# Patient Record
Sex: Male | Born: 1955
Health system: Southern US, Community
[De-identification: ages and names within clinical notes are randomized; demographics above are authoritative.]

## PROBLEM LIST (undated history)

## (undated) DIAGNOSIS — I350 Nonrheumatic aortic (valve) stenosis: Secondary | ICD-10-CM

## (undated) DIAGNOSIS — M5136 Other intervertebral disc degeneration, lumbar region: Secondary | ICD-10-CM

## (undated) DIAGNOSIS — I4891 Unspecified atrial fibrillation: Secondary | ICD-10-CM

## (undated) DIAGNOSIS — M170 Bilateral primary osteoarthritis of knee: Secondary | ICD-10-CM

## (undated) DIAGNOSIS — M25569 Pain in unspecified knee: Secondary | ICD-10-CM

## (undated) DIAGNOSIS — I10 Essential (primary) hypertension: Secondary | ICD-10-CM

## (undated) DIAGNOSIS — Z87442 Personal history of urinary calculi: Secondary | ICD-10-CM

## (undated) DIAGNOSIS — G4730 Sleep apnea, unspecified: Secondary | ICD-10-CM

## (undated) DIAGNOSIS — G4733 Obstructive sleep apnea (adult) (pediatric): Secondary | ICD-10-CM

## (undated) DIAGNOSIS — I509 Heart failure, unspecified: Secondary | ICD-10-CM

## (undated) DIAGNOSIS — G894 Chronic pain syndrome: Secondary | ICD-10-CM

## (undated) DIAGNOSIS — G4700 Insomnia, unspecified: Secondary | ICD-10-CM

## (undated) DIAGNOSIS — T7840XA Allergy, unspecified, initial encounter: Secondary | ICD-10-CM

## (undated) DIAGNOSIS — M545 Low back pain: Secondary | ICD-10-CM

## (undated) DIAGNOSIS — M1990 Unspecified osteoarthritis, unspecified site: Secondary | ICD-10-CM

## (undated) DIAGNOSIS — I272 Pulmonary hypertension, unspecified: Secondary | ICD-10-CM

## (undated) HISTORY — DX: Unspecified atrial fibrillation: I48.91

## (undated) HISTORY — PX: LEG SURGERY: SHX1003

## (undated) HISTORY — PX: HERNIA REPAIR: SHX51

## (undated) HISTORY — DX: Essential (primary) hypertension: I10

## (undated) HISTORY — DX: Sleep apnea, unspecified: G47.30

## (undated) HISTORY — DX: Pain in unspecified knee: M25.569

## (undated) HISTORY — DX: Allergy, unspecified, initial encounter: T78.40XA

## (undated) HISTORY — DX: Other intervertebral disc degeneration, lumbar region: M51.36

## (undated) HISTORY — DX: Bilateral primary osteoarthritis of knee: M17.0

## (undated) HISTORY — DX: Heart failure, unspecified: I50.9

## (undated) HISTORY — DX: Chronic pain syndrome: G89.4

## (undated) HISTORY — DX: Obstructive sleep apnea (adult) (pediatric): G47.33

## (undated) HISTORY — DX: Insomnia, unspecified: G47.00

## (undated) HISTORY — DX: Low back pain: M54.5

## (undated) HISTORY — DX: Morbid (severe) obesity due to excess calories: E66.01

## (undated) HISTORY — PX: FRACTURE SURGERY: SHX138

## (undated) HISTORY — DX: Unspecified osteoarthritis, unspecified site: M19.90

---

## 1990-07-19 DIAGNOSIS — Z87442 Personal history of urinary calculi: Secondary | ICD-10-CM

## 1990-07-19 HISTORY — DX: Personal history of urinary calculi: Z87.442

## 2012-11-30 DIAGNOSIS — M25569 Pain in unspecified knee: Secondary | ICD-10-CM

## 2012-11-30 HISTORY — DX: Pain in unspecified knee: M25.569

## 2012-12-28 DIAGNOSIS — M170 Bilateral primary osteoarthritis of knee: Secondary | ICD-10-CM

## 2012-12-28 HISTORY — DX: Bilateral primary osteoarthritis of knee: M17.0

## 2013-01-29 DIAGNOSIS — M51369 Other intervertebral disc degeneration, lumbar region without mention of lumbar back pain or lower extremity pain: Secondary | ICD-10-CM

## 2013-01-29 DIAGNOSIS — M5136 Other intervertebral disc degeneration, lumbar region: Secondary | ICD-10-CM

## 2013-01-29 HISTORY — DX: Other intervertebral disc degeneration, lumbar region without mention of lumbar back pain or lower extremity pain: M51.369

## 2013-01-29 HISTORY — DX: Other intervertebral disc degeneration, lumbar region: M51.36

## 2013-05-28 DIAGNOSIS — I10 Essential (primary) hypertension: Secondary | ICD-10-CM | POA: Diagnosis not present

## 2013-05-28 DIAGNOSIS — L02419 Cutaneous abscess of limb, unspecified: Secondary | ICD-10-CM | POA: Diagnosis not present

## 2013-05-28 DIAGNOSIS — L97909 Non-pressure chronic ulcer of unspecified part of unspecified lower leg with unspecified severity: Secondary | ICD-10-CM | POA: Diagnosis not present

## 2013-05-28 DIAGNOSIS — I872 Venous insufficiency (chronic) (peripheral): Secondary | ICD-10-CM | POA: Diagnosis not present

## 2013-06-05 DIAGNOSIS — I872 Venous insufficiency (chronic) (peripheral): Secondary | ICD-10-CM | POA: Diagnosis not present

## 2013-06-05 DIAGNOSIS — L02419 Cutaneous abscess of limb, unspecified: Secondary | ICD-10-CM | POA: Diagnosis not present

## 2013-06-05 DIAGNOSIS — I10 Essential (primary) hypertension: Secondary | ICD-10-CM | POA: Diagnosis not present

## 2013-06-05 DIAGNOSIS — L97909 Non-pressure chronic ulcer of unspecified part of unspecified lower leg with unspecified severity: Secondary | ICD-10-CM | POA: Diagnosis not present

## 2013-06-09 DIAGNOSIS — I10 Essential (primary) hypertension: Secondary | ICD-10-CM | POA: Diagnosis not present

## 2013-06-09 DIAGNOSIS — L02419 Cutaneous abscess of limb, unspecified: Secondary | ICD-10-CM | POA: Diagnosis not present

## 2013-06-09 DIAGNOSIS — I872 Venous insufficiency (chronic) (peripheral): Secondary | ICD-10-CM | POA: Diagnosis not present

## 2013-06-09 DIAGNOSIS — L97909 Non-pressure chronic ulcer of unspecified part of unspecified lower leg with unspecified severity: Secondary | ICD-10-CM | POA: Diagnosis not present

## 2013-06-11 DIAGNOSIS — L97909 Non-pressure chronic ulcer of unspecified part of unspecified lower leg with unspecified severity: Secondary | ICD-10-CM | POA: Diagnosis not present

## 2013-06-11 DIAGNOSIS — I10 Essential (primary) hypertension: Secondary | ICD-10-CM | POA: Diagnosis not present

## 2013-06-11 DIAGNOSIS — L02419 Cutaneous abscess of limb, unspecified: Secondary | ICD-10-CM | POA: Diagnosis not present

## 2013-06-11 DIAGNOSIS — I872 Venous insufficiency (chronic) (peripheral): Secondary | ICD-10-CM | POA: Diagnosis not present

## 2013-06-13 DIAGNOSIS — I10 Essential (primary) hypertension: Secondary | ICD-10-CM | POA: Diagnosis not present

## 2013-06-13 DIAGNOSIS — L97909 Non-pressure chronic ulcer of unspecified part of unspecified lower leg with unspecified severity: Secondary | ICD-10-CM | POA: Diagnosis not present

## 2013-06-13 DIAGNOSIS — L02419 Cutaneous abscess of limb, unspecified: Secondary | ICD-10-CM | POA: Diagnosis not present

## 2013-06-13 DIAGNOSIS — I872 Venous insufficiency (chronic) (peripheral): Secondary | ICD-10-CM | POA: Diagnosis not present

## 2013-06-15 DIAGNOSIS — I872 Venous insufficiency (chronic) (peripheral): Secondary | ICD-10-CM | POA: Diagnosis not present

## 2013-06-15 DIAGNOSIS — I10 Essential (primary) hypertension: Secondary | ICD-10-CM | POA: Diagnosis not present

## 2013-06-15 DIAGNOSIS — L02419 Cutaneous abscess of limb, unspecified: Secondary | ICD-10-CM | POA: Diagnosis not present

## 2013-06-15 DIAGNOSIS — L97909 Non-pressure chronic ulcer of unspecified part of unspecified lower leg with unspecified severity: Secondary | ICD-10-CM | POA: Diagnosis not present

## 2013-06-21 DIAGNOSIS — I872 Venous insufficiency (chronic) (peripheral): Secondary | ICD-10-CM | POA: Diagnosis not present

## 2013-06-21 DIAGNOSIS — I10 Essential (primary) hypertension: Secondary | ICD-10-CM | POA: Diagnosis not present

## 2013-06-21 DIAGNOSIS — L02419 Cutaneous abscess of limb, unspecified: Secondary | ICD-10-CM | POA: Diagnosis not present

## 2013-06-21 DIAGNOSIS — L97909 Non-pressure chronic ulcer of unspecified part of unspecified lower leg with unspecified severity: Secondary | ICD-10-CM | POA: Diagnosis not present

## 2013-06-23 DIAGNOSIS — I10 Essential (primary) hypertension: Secondary | ICD-10-CM | POA: Diagnosis not present

## 2013-06-23 DIAGNOSIS — I872 Venous insufficiency (chronic) (peripheral): Secondary | ICD-10-CM | POA: Diagnosis not present

## 2013-06-23 DIAGNOSIS — L97909 Non-pressure chronic ulcer of unspecified part of unspecified lower leg with unspecified severity: Secondary | ICD-10-CM | POA: Diagnosis not present

## 2013-06-23 DIAGNOSIS — L02419 Cutaneous abscess of limb, unspecified: Secondary | ICD-10-CM | POA: Diagnosis not present

## 2013-06-26 DIAGNOSIS — I10 Essential (primary) hypertension: Secondary | ICD-10-CM | POA: Diagnosis not present

## 2013-06-26 DIAGNOSIS — I872 Venous insufficiency (chronic) (peripheral): Secondary | ICD-10-CM | POA: Diagnosis not present

## 2013-06-26 DIAGNOSIS — L97909 Non-pressure chronic ulcer of unspecified part of unspecified lower leg with unspecified severity: Secondary | ICD-10-CM | POA: Diagnosis not present

## 2013-06-26 DIAGNOSIS — L02419 Cutaneous abscess of limb, unspecified: Secondary | ICD-10-CM | POA: Diagnosis not present

## 2013-06-30 DIAGNOSIS — I10 Essential (primary) hypertension: Secondary | ICD-10-CM | POA: Diagnosis not present

## 2013-06-30 DIAGNOSIS — L97909 Non-pressure chronic ulcer of unspecified part of unspecified lower leg with unspecified severity: Secondary | ICD-10-CM | POA: Diagnosis not present

## 2013-06-30 DIAGNOSIS — L02419 Cutaneous abscess of limb, unspecified: Secondary | ICD-10-CM | POA: Diagnosis not present

## 2013-06-30 DIAGNOSIS — I872 Venous insufficiency (chronic) (peripheral): Secondary | ICD-10-CM | POA: Diagnosis not present

## 2013-07-07 DIAGNOSIS — L97909 Non-pressure chronic ulcer of unspecified part of unspecified lower leg with unspecified severity: Secondary | ICD-10-CM | POA: Diagnosis not present

## 2013-07-07 DIAGNOSIS — I10 Essential (primary) hypertension: Secondary | ICD-10-CM | POA: Diagnosis not present

## 2013-07-07 DIAGNOSIS — L02419 Cutaneous abscess of limb, unspecified: Secondary | ICD-10-CM | POA: Diagnosis not present

## 2013-07-07 DIAGNOSIS — I872 Venous insufficiency (chronic) (peripheral): Secondary | ICD-10-CM | POA: Diagnosis not present

## 2013-07-10 DIAGNOSIS — L97909 Non-pressure chronic ulcer of unspecified part of unspecified lower leg with unspecified severity: Secondary | ICD-10-CM | POA: Diagnosis not present

## 2013-07-10 DIAGNOSIS — I10 Essential (primary) hypertension: Secondary | ICD-10-CM | POA: Diagnosis not present

## 2013-07-10 DIAGNOSIS — L02419 Cutaneous abscess of limb, unspecified: Secondary | ICD-10-CM | POA: Diagnosis not present

## 2013-07-10 DIAGNOSIS — I872 Venous insufficiency (chronic) (peripheral): Secondary | ICD-10-CM | POA: Diagnosis not present

## 2013-07-17 DIAGNOSIS — L97909 Non-pressure chronic ulcer of unspecified part of unspecified lower leg with unspecified severity: Secondary | ICD-10-CM | POA: Diagnosis not present

## 2013-07-17 DIAGNOSIS — L02419 Cutaneous abscess of limb, unspecified: Secondary | ICD-10-CM | POA: Diagnosis not present

## 2013-07-17 DIAGNOSIS — I10 Essential (primary) hypertension: Secondary | ICD-10-CM | POA: Diagnosis not present

## 2013-07-17 DIAGNOSIS — I872 Venous insufficiency (chronic) (peripheral): Secondary | ICD-10-CM | POA: Diagnosis not present

## 2013-07-24 DIAGNOSIS — L97909 Non-pressure chronic ulcer of unspecified part of unspecified lower leg with unspecified severity: Secondary | ICD-10-CM | POA: Diagnosis not present

## 2013-07-24 DIAGNOSIS — L02419 Cutaneous abscess of limb, unspecified: Secondary | ICD-10-CM | POA: Diagnosis not present

## 2013-07-24 DIAGNOSIS — I872 Venous insufficiency (chronic) (peripheral): Secondary | ICD-10-CM | POA: Diagnosis not present

## 2013-07-24 DIAGNOSIS — I10 Essential (primary) hypertension: Secondary | ICD-10-CM | POA: Diagnosis not present

## 2013-07-24 DIAGNOSIS — L03119 Cellulitis of unspecified part of limb: Secondary | ICD-10-CM | POA: Diagnosis not present

## 2013-07-26 DIAGNOSIS — L03119 Cellulitis of unspecified part of limb: Secondary | ICD-10-CM | POA: Diagnosis not present

## 2013-07-26 DIAGNOSIS — I872 Venous insufficiency (chronic) (peripheral): Secondary | ICD-10-CM | POA: Diagnosis not present

## 2013-07-26 DIAGNOSIS — L02419 Cutaneous abscess of limb, unspecified: Secondary | ICD-10-CM | POA: Diagnosis not present

## 2013-07-26 DIAGNOSIS — L97209 Non-pressure chronic ulcer of unspecified calf with unspecified severity: Secondary | ICD-10-CM | POA: Diagnosis not present

## 2013-07-26 DIAGNOSIS — L97809 Non-pressure chronic ulcer of other part of unspecified lower leg with unspecified severity: Secondary | ICD-10-CM | POA: Diagnosis not present

## 2013-07-26 DIAGNOSIS — L4050 Arthropathic psoriasis, unspecified: Secondary | ICD-10-CM | POA: Diagnosis not present

## 2013-07-26 DIAGNOSIS — I10 Essential (primary) hypertension: Secondary | ICD-10-CM | POA: Diagnosis not present

## 2013-07-26 DIAGNOSIS — I890 Lymphedema, not elsewhere classified: Secondary | ICD-10-CM | POA: Diagnosis not present

## 2013-07-27 DIAGNOSIS — L02419 Cutaneous abscess of limb, unspecified: Secondary | ICD-10-CM | POA: Diagnosis not present

## 2013-07-27 DIAGNOSIS — L97909 Non-pressure chronic ulcer of unspecified part of unspecified lower leg with unspecified severity: Secondary | ICD-10-CM | POA: Diagnosis not present

## 2013-07-27 DIAGNOSIS — I872 Venous insufficiency (chronic) (peripheral): Secondary | ICD-10-CM | POA: Diagnosis not present

## 2013-07-27 DIAGNOSIS — L97929 Non-pressure chronic ulcer of unspecified part of left lower leg with unspecified severity: Secondary | ICD-10-CM | POA: Diagnosis not present

## 2013-07-27 DIAGNOSIS — I10 Essential (primary) hypertension: Secondary | ICD-10-CM | POA: Diagnosis not present

## 2013-07-27 DIAGNOSIS — I83219 Varicose veins of right lower extremity with both ulcer of unspecified site and inflammation: Secondary | ICD-10-CM | POA: Diagnosis not present

## 2013-07-30 DIAGNOSIS — L97919 Non-pressure chronic ulcer of unspecified part of right lower leg with unspecified severity: Secondary | ICD-10-CM | POA: Diagnosis not present

## 2013-07-30 DIAGNOSIS — L02419 Cutaneous abscess of limb, unspecified: Secondary | ICD-10-CM | POA: Diagnosis not present

## 2013-07-30 DIAGNOSIS — I10 Essential (primary) hypertension: Secondary | ICD-10-CM | POA: Diagnosis not present

## 2013-07-30 DIAGNOSIS — I83229 Varicose veins of left lower extremity with both ulcer of unspecified site and inflammation: Secondary | ICD-10-CM | POA: Diagnosis not present

## 2013-07-30 DIAGNOSIS — I83219 Varicose veins of right lower extremity with both ulcer of unspecified site and inflammation: Secondary | ICD-10-CM | POA: Diagnosis not present

## 2013-07-30 DIAGNOSIS — I872 Venous insufficiency (chronic) (peripheral): Secondary | ICD-10-CM | POA: Diagnosis not present

## 2013-07-30 DIAGNOSIS — L97909 Non-pressure chronic ulcer of unspecified part of unspecified lower leg with unspecified severity: Secondary | ICD-10-CM | POA: Diagnosis not present

## 2013-07-30 DIAGNOSIS — L03119 Cellulitis of unspecified part of limb: Secondary | ICD-10-CM | POA: Diagnosis not present

## 2013-07-31 DIAGNOSIS — M5137 Other intervertebral disc degeneration, lumbosacral region: Secondary | ICD-10-CM | POA: Diagnosis not present

## 2013-07-31 DIAGNOSIS — G894 Chronic pain syndrome: Secondary | ICD-10-CM | POA: Diagnosis not present

## 2013-07-31 DIAGNOSIS — M1710 Unilateral primary osteoarthritis, unspecified knee: Secondary | ICD-10-CM | POA: Diagnosis not present

## 2013-08-01 DIAGNOSIS — L03119 Cellulitis of unspecified part of limb: Secondary | ICD-10-CM | POA: Diagnosis not present

## 2013-08-01 DIAGNOSIS — L97909 Non-pressure chronic ulcer of unspecified part of unspecified lower leg with unspecified severity: Secondary | ICD-10-CM | POA: Diagnosis not present

## 2013-08-01 DIAGNOSIS — I872 Venous insufficiency (chronic) (peripheral): Secondary | ICD-10-CM | POA: Diagnosis not present

## 2013-08-01 DIAGNOSIS — I83219 Varicose veins of right lower extremity with both ulcer of unspecified site and inflammation: Secondary | ICD-10-CM | POA: Diagnosis not present

## 2013-08-01 DIAGNOSIS — I10 Essential (primary) hypertension: Secondary | ICD-10-CM | POA: Diagnosis not present

## 2013-08-01 DIAGNOSIS — L02419 Cutaneous abscess of limb, unspecified: Secondary | ICD-10-CM | POA: Diagnosis not present

## 2013-08-02 DIAGNOSIS — I890 Lymphedema, not elsewhere classified: Secondary | ICD-10-CM | POA: Diagnosis not present

## 2013-08-02 DIAGNOSIS — L02419 Cutaneous abscess of limb, unspecified: Secondary | ICD-10-CM | POA: Diagnosis not present

## 2013-08-02 DIAGNOSIS — L97809 Non-pressure chronic ulcer of other part of unspecified lower leg with unspecified severity: Secondary | ICD-10-CM | POA: Diagnosis not present

## 2013-08-02 DIAGNOSIS — L4050 Arthropathic psoriasis, unspecified: Secondary | ICD-10-CM | POA: Diagnosis not present

## 2013-08-02 DIAGNOSIS — I872 Venous insufficiency (chronic) (peripheral): Secondary | ICD-10-CM | POA: Diagnosis not present

## 2013-08-02 DIAGNOSIS — I10 Essential (primary) hypertension: Secondary | ICD-10-CM | POA: Diagnosis not present

## 2013-08-02 DIAGNOSIS — I972 Postmastectomy lymphedema syndrome: Secondary | ICD-10-CM | POA: Diagnosis not present

## 2013-08-02 DIAGNOSIS — L97209 Non-pressure chronic ulcer of unspecified calf with unspecified severity: Secondary | ICD-10-CM | POA: Diagnosis not present

## 2013-08-02 DIAGNOSIS — L03119 Cellulitis of unspecified part of limb: Secondary | ICD-10-CM | POA: Diagnosis not present

## 2013-08-06 DIAGNOSIS — M545 Low back pain, unspecified: Secondary | ICD-10-CM | POA: Diagnosis not present

## 2013-08-06 DIAGNOSIS — R5381 Other malaise: Secondary | ICD-10-CM | POA: Diagnosis not present

## 2013-08-06 DIAGNOSIS — F339 Major depressive disorder, recurrent, unspecified: Secondary | ICD-10-CM | POA: Diagnosis not present

## 2013-08-06 DIAGNOSIS — M129 Arthropathy, unspecified: Secondary | ICD-10-CM | POA: Diagnosis not present

## 2013-08-06 DIAGNOSIS — R339 Retention of urine, unspecified: Secondary | ICD-10-CM | POA: Diagnosis not present

## 2013-08-06 DIAGNOSIS — I10 Essential (primary) hypertension: Secondary | ICD-10-CM | POA: Diagnosis not present

## 2013-08-06 DIAGNOSIS — R5383 Other fatigue: Secondary | ICD-10-CM | POA: Diagnosis not present

## 2013-08-16 DIAGNOSIS — L02419 Cutaneous abscess of limb, unspecified: Secondary | ICD-10-CM | POA: Diagnosis not present

## 2013-08-16 DIAGNOSIS — L97209 Non-pressure chronic ulcer of unspecified calf with unspecified severity: Secondary | ICD-10-CM | POA: Diagnosis not present

## 2013-08-16 DIAGNOSIS — I890 Lymphedema, not elsewhere classified: Secondary | ICD-10-CM | POA: Diagnosis not present

## 2013-08-16 DIAGNOSIS — L97809 Non-pressure chronic ulcer of other part of unspecified lower leg with unspecified severity: Secondary | ICD-10-CM | POA: Diagnosis not present

## 2013-08-16 DIAGNOSIS — I872 Venous insufficiency (chronic) (peripheral): Secondary | ICD-10-CM | POA: Diagnosis not present

## 2013-08-16 DIAGNOSIS — L4050 Arthropathic psoriasis, unspecified: Secondary | ICD-10-CM | POA: Diagnosis not present

## 2013-08-16 DIAGNOSIS — L03119 Cellulitis of unspecified part of limb: Secondary | ICD-10-CM | POA: Diagnosis not present

## 2013-08-16 DIAGNOSIS — I10 Essential (primary) hypertension: Secondary | ICD-10-CM | POA: Diagnosis not present

## 2013-08-20 DIAGNOSIS — I972 Postmastectomy lymphedema syndrome: Secondary | ICD-10-CM | POA: Diagnosis not present

## 2013-08-20 DIAGNOSIS — I890 Lymphedema, not elsewhere classified: Secondary | ICD-10-CM | POA: Diagnosis not present

## 2013-08-21 DIAGNOSIS — Z880 Allergy status to penicillin: Secondary | ICD-10-CM | POA: Diagnosis not present

## 2013-08-21 DIAGNOSIS — M545 Low back pain, unspecified: Secondary | ICD-10-CM | POA: Diagnosis not present

## 2013-08-21 DIAGNOSIS — E291 Testicular hypofunction: Secondary | ICD-10-CM | POA: Diagnosis not present

## 2013-08-21 DIAGNOSIS — I10 Essential (primary) hypertension: Secondary | ICD-10-CM | POA: Diagnosis not present

## 2013-08-21 DIAGNOSIS — G894 Chronic pain syndrome: Secondary | ICD-10-CM | POA: Diagnosis not present

## 2013-08-21 DIAGNOSIS — IMO0002 Reserved for concepts with insufficient information to code with codable children: Secondary | ICD-10-CM | POA: Diagnosis not present

## 2013-08-21 DIAGNOSIS — Z79899 Other long term (current) drug therapy: Secondary | ICD-10-CM | POA: Diagnosis not present

## 2013-08-21 DIAGNOSIS — Z887 Allergy status to serum and vaccine status: Secondary | ICD-10-CM | POA: Diagnosis not present

## 2013-08-21 DIAGNOSIS — Z8249 Family history of ischemic heart disease and other diseases of the circulatory system: Secondary | ICD-10-CM | POA: Diagnosis not present

## 2013-08-30 DIAGNOSIS — G894 Chronic pain syndrome: Secondary | ICD-10-CM | POA: Diagnosis not present

## 2013-08-30 DIAGNOSIS — M5137 Other intervertebral disc degeneration, lumbosacral region: Secondary | ICD-10-CM | POA: Diagnosis not present

## 2013-08-30 DIAGNOSIS — M545 Low back pain, unspecified: Secondary | ICD-10-CM | POA: Diagnosis not present

## 2013-08-30 DIAGNOSIS — M1710 Unilateral primary osteoarthritis, unspecified knee: Secondary | ICD-10-CM | POA: Diagnosis not present

## 2013-08-31 DIAGNOSIS — L03119 Cellulitis of unspecified part of limb: Secondary | ICD-10-CM | POA: Diagnosis not present

## 2013-08-31 DIAGNOSIS — L4050 Arthropathic psoriasis, unspecified: Secondary | ICD-10-CM | POA: Diagnosis not present

## 2013-08-31 DIAGNOSIS — L02419 Cutaneous abscess of limb, unspecified: Secondary | ICD-10-CM | POA: Diagnosis not present

## 2013-08-31 DIAGNOSIS — L97809 Non-pressure chronic ulcer of other part of unspecified lower leg with unspecified severity: Secondary | ICD-10-CM | POA: Diagnosis not present

## 2013-08-31 DIAGNOSIS — L97209 Non-pressure chronic ulcer of unspecified calf with unspecified severity: Secondary | ICD-10-CM | POA: Diagnosis not present

## 2013-08-31 DIAGNOSIS — I872 Venous insufficiency (chronic) (peripheral): Secondary | ICD-10-CM | POA: Diagnosis not present

## 2013-08-31 DIAGNOSIS — I10 Essential (primary) hypertension: Secondary | ICD-10-CM | POA: Diagnosis not present

## 2013-08-31 DIAGNOSIS — I890 Lymphedema, not elsewhere classified: Secondary | ICD-10-CM | POA: Diagnosis not present

## 2013-09-19 DIAGNOSIS — M1710 Unilateral primary osteoarthritis, unspecified knee: Secondary | ICD-10-CM | POA: Diagnosis not present

## 2013-09-21 DIAGNOSIS — Z79899 Other long term (current) drug therapy: Secondary | ICD-10-CM | POA: Diagnosis not present

## 2013-09-21 DIAGNOSIS — I890 Lymphedema, not elsewhere classified: Secondary | ICD-10-CM | POA: Diagnosis not present

## 2013-09-21 DIAGNOSIS — L4050 Arthropathic psoriasis, unspecified: Secondary | ICD-10-CM | POA: Diagnosis not present

## 2013-09-21 DIAGNOSIS — I10 Essential (primary) hypertension: Secondary | ICD-10-CM | POA: Diagnosis not present

## 2013-09-21 DIAGNOSIS — L02419 Cutaneous abscess of limb, unspecified: Secondary | ICD-10-CM | POA: Diagnosis not present

## 2013-09-21 DIAGNOSIS — M25569 Pain in unspecified knee: Secondary | ICD-10-CM | POA: Diagnosis not present

## 2013-09-21 DIAGNOSIS — L97809 Non-pressure chronic ulcer of other part of unspecified lower leg with unspecified severity: Secondary | ICD-10-CM | POA: Diagnosis not present

## 2013-09-21 DIAGNOSIS — M549 Dorsalgia, unspecified: Secondary | ICD-10-CM | POA: Diagnosis not present

## 2013-09-21 DIAGNOSIS — I872 Venous insufficiency (chronic) (peripheral): Secondary | ICD-10-CM | POA: Diagnosis not present

## 2013-09-21 DIAGNOSIS — G8929 Other chronic pain: Secondary | ICD-10-CM | POA: Diagnosis not present

## 2013-09-22 DIAGNOSIS — L02419 Cutaneous abscess of limb, unspecified: Secondary | ICD-10-CM | POA: Diagnosis not present

## 2013-09-22 DIAGNOSIS — L97809 Non-pressure chronic ulcer of other part of unspecified lower leg with unspecified severity: Secondary | ICD-10-CM | POA: Diagnosis not present

## 2013-09-22 DIAGNOSIS — L4050 Arthropathic psoriasis, unspecified: Secondary | ICD-10-CM | POA: Diagnosis not present

## 2013-09-22 DIAGNOSIS — I872 Venous insufficiency (chronic) (peripheral): Secondary | ICD-10-CM | POA: Diagnosis not present

## 2013-09-22 DIAGNOSIS — I10 Essential (primary) hypertension: Secondary | ICD-10-CM | POA: Diagnosis not present

## 2013-09-22 DIAGNOSIS — I890 Lymphedema, not elsewhere classified: Secondary | ICD-10-CM | POA: Diagnosis not present

## 2013-09-23 DIAGNOSIS — I10 Essential (primary) hypertension: Secondary | ICD-10-CM | POA: Diagnosis not present

## 2013-09-23 DIAGNOSIS — L4050 Arthropathic psoriasis, unspecified: Secondary | ICD-10-CM | POA: Diagnosis not present

## 2013-09-23 DIAGNOSIS — I872 Venous insufficiency (chronic) (peripheral): Secondary | ICD-10-CM | POA: Diagnosis not present

## 2013-09-23 DIAGNOSIS — L02419 Cutaneous abscess of limb, unspecified: Secondary | ICD-10-CM | POA: Diagnosis not present

## 2013-09-23 DIAGNOSIS — L97809 Non-pressure chronic ulcer of other part of unspecified lower leg with unspecified severity: Secondary | ICD-10-CM | POA: Diagnosis not present

## 2013-09-23 DIAGNOSIS — I890 Lymphedema, not elsewhere classified: Secondary | ICD-10-CM | POA: Diagnosis not present

## 2013-09-25 DIAGNOSIS — M545 Low back pain, unspecified: Secondary | ICD-10-CM | POA: Diagnosis not present

## 2013-09-25 DIAGNOSIS — I10 Essential (primary) hypertension: Secondary | ICD-10-CM | POA: Diagnosis not present

## 2013-09-26 DIAGNOSIS — I10 Essential (primary) hypertension: Secondary | ICD-10-CM | POA: Diagnosis not present

## 2013-09-26 DIAGNOSIS — L02419 Cutaneous abscess of limb, unspecified: Secondary | ICD-10-CM | POA: Diagnosis not present

## 2013-09-26 DIAGNOSIS — I872 Venous insufficiency (chronic) (peripheral): Secondary | ICD-10-CM | POA: Diagnosis not present

## 2013-09-26 DIAGNOSIS — I890 Lymphedema, not elsewhere classified: Secondary | ICD-10-CM | POA: Diagnosis not present

## 2013-09-26 DIAGNOSIS — L97809 Non-pressure chronic ulcer of other part of unspecified lower leg with unspecified severity: Secondary | ICD-10-CM | POA: Diagnosis not present

## 2013-09-26 DIAGNOSIS — L4050 Arthropathic psoriasis, unspecified: Secondary | ICD-10-CM | POA: Diagnosis not present

## 2013-09-27 DIAGNOSIS — R1110 Vomiting, unspecified: Secondary | ICD-10-CM | POA: Diagnosis not present

## 2013-09-27 DIAGNOSIS — E860 Dehydration: Secondary | ICD-10-CM | POA: Diagnosis not present

## 2013-09-27 DIAGNOSIS — M549 Dorsalgia, unspecified: Secondary | ICD-10-CM | POA: Diagnosis present

## 2013-09-27 DIAGNOSIS — Z6841 Body Mass Index (BMI) 40.0 and over, adult: Secondary | ICD-10-CM | POA: Diagnosis not present

## 2013-09-27 DIAGNOSIS — G4733 Obstructive sleep apnea (adult) (pediatric): Secondary | ICD-10-CM | POA: Diagnosis present

## 2013-09-27 DIAGNOSIS — R197 Diarrhea, unspecified: Secondary | ICD-10-CM | POA: Diagnosis not present

## 2013-09-27 DIAGNOSIS — R5383 Other fatigue: Secondary | ICD-10-CM | POA: Diagnosis not present

## 2013-09-27 DIAGNOSIS — R161 Splenomegaly, not elsewhere classified: Secondary | ICD-10-CM | POA: Diagnosis not present

## 2013-09-27 DIAGNOSIS — I872 Venous insufficiency (chronic) (peripheral): Secondary | ICD-10-CM | POA: Diagnosis not present

## 2013-09-27 DIAGNOSIS — R5381 Other malaise: Secondary | ICD-10-CM | POA: Diagnosis not present

## 2013-09-27 DIAGNOSIS — G8929 Other chronic pain: Secondary | ICD-10-CM | POA: Diagnosis present

## 2013-09-27 DIAGNOSIS — I10 Essential (primary) hypertension: Secondary | ICD-10-CM | POA: Diagnosis not present

## 2013-09-27 DIAGNOSIS — A059 Bacterial foodborne intoxication, unspecified: Secondary | ICD-10-CM | POA: Diagnosis not present

## 2013-09-27 DIAGNOSIS — E669 Obesity, unspecified: Secondary | ICD-10-CM | POA: Diagnosis not present

## 2013-09-27 DIAGNOSIS — R110 Nausea: Secondary | ICD-10-CM | POA: Diagnosis not present

## 2013-09-27 DIAGNOSIS — L97909 Non-pressure chronic ulcer of unspecified part of unspecified lower leg with unspecified severity: Secondary | ICD-10-CM | POA: Diagnosis not present

## 2013-09-27 DIAGNOSIS — R112 Nausea with vomiting, unspecified: Secondary | ICD-10-CM | POA: Diagnosis not present

## 2013-09-27 DIAGNOSIS — R55 Syncope and collapse: Secondary | ICD-10-CM | POA: Diagnosis not present

## 2013-09-27 DIAGNOSIS — Z79899 Other long term (current) drug therapy: Secondary | ICD-10-CM | POA: Diagnosis not present

## 2013-10-02 DIAGNOSIS — L02419 Cutaneous abscess of limb, unspecified: Secondary | ICD-10-CM | POA: Diagnosis not present

## 2013-10-02 DIAGNOSIS — I10 Essential (primary) hypertension: Secondary | ICD-10-CM | POA: Diagnosis not present

## 2013-10-02 DIAGNOSIS — I872 Venous insufficiency (chronic) (peripheral): Secondary | ICD-10-CM | POA: Diagnosis not present

## 2013-10-02 DIAGNOSIS — I890 Lymphedema, not elsewhere classified: Secondary | ICD-10-CM | POA: Diagnosis not present

## 2013-10-02 DIAGNOSIS — L97809 Non-pressure chronic ulcer of other part of unspecified lower leg with unspecified severity: Secondary | ICD-10-CM | POA: Diagnosis not present

## 2013-10-02 DIAGNOSIS — L4050 Arthropathic psoriasis, unspecified: Secondary | ICD-10-CM | POA: Diagnosis not present

## 2013-10-04 DIAGNOSIS — L97809 Non-pressure chronic ulcer of other part of unspecified lower leg with unspecified severity: Secondary | ICD-10-CM | POA: Diagnosis not present

## 2013-10-04 DIAGNOSIS — L03119 Cellulitis of unspecified part of limb: Secondary | ICD-10-CM | POA: Diagnosis not present

## 2013-10-04 DIAGNOSIS — I872 Venous insufficiency (chronic) (peripheral): Secondary | ICD-10-CM | POA: Diagnosis not present

## 2013-10-04 DIAGNOSIS — L02419 Cutaneous abscess of limb, unspecified: Secondary | ICD-10-CM | POA: Diagnosis not present

## 2013-10-04 DIAGNOSIS — L4050 Arthropathic psoriasis, unspecified: Secondary | ICD-10-CM | POA: Diagnosis not present

## 2013-10-04 DIAGNOSIS — I10 Essential (primary) hypertension: Secondary | ICD-10-CM | POA: Diagnosis not present

## 2013-10-04 DIAGNOSIS — I890 Lymphedema, not elsewhere classified: Secondary | ICD-10-CM | POA: Diagnosis not present

## 2013-10-05 DIAGNOSIS — M545 Low back pain, unspecified: Secondary | ICD-10-CM | POA: Diagnosis not present

## 2013-10-05 DIAGNOSIS — I10 Essential (primary) hypertension: Secondary | ICD-10-CM | POA: Diagnosis not present

## 2013-10-06 DIAGNOSIS — L02419 Cutaneous abscess of limb, unspecified: Secondary | ICD-10-CM | POA: Diagnosis not present

## 2013-10-06 DIAGNOSIS — I890 Lymphedema, not elsewhere classified: Secondary | ICD-10-CM | POA: Diagnosis not present

## 2013-10-06 DIAGNOSIS — I10 Essential (primary) hypertension: Secondary | ICD-10-CM | POA: Diagnosis not present

## 2013-10-06 DIAGNOSIS — L97809 Non-pressure chronic ulcer of other part of unspecified lower leg with unspecified severity: Secondary | ICD-10-CM | POA: Diagnosis not present

## 2013-10-06 DIAGNOSIS — L4050 Arthropathic psoriasis, unspecified: Secondary | ICD-10-CM | POA: Diagnosis not present

## 2013-10-06 DIAGNOSIS — I872 Venous insufficiency (chronic) (peripheral): Secondary | ICD-10-CM | POA: Diagnosis not present

## 2013-10-10 DIAGNOSIS — I872 Venous insufficiency (chronic) (peripheral): Secondary | ICD-10-CM | POA: Diagnosis not present

## 2013-10-10 DIAGNOSIS — L02419 Cutaneous abscess of limb, unspecified: Secondary | ICD-10-CM | POA: Diagnosis not present

## 2013-10-10 DIAGNOSIS — I890 Lymphedema, not elsewhere classified: Secondary | ICD-10-CM | POA: Diagnosis not present

## 2013-10-10 DIAGNOSIS — L97809 Non-pressure chronic ulcer of other part of unspecified lower leg with unspecified severity: Secondary | ICD-10-CM | POA: Diagnosis not present

## 2013-10-10 DIAGNOSIS — I10 Essential (primary) hypertension: Secondary | ICD-10-CM | POA: Diagnosis not present

## 2013-10-10 DIAGNOSIS — L4050 Arthropathic psoriasis, unspecified: Secondary | ICD-10-CM | POA: Diagnosis not present

## 2013-10-10 DIAGNOSIS — L03119 Cellulitis of unspecified part of limb: Secondary | ICD-10-CM | POA: Diagnosis not present

## 2013-10-11 DIAGNOSIS — I10 Essential (primary) hypertension: Secondary | ICD-10-CM | POA: Diagnosis not present

## 2013-10-11 DIAGNOSIS — L97909 Non-pressure chronic ulcer of unspecified part of unspecified lower leg with unspecified severity: Secondary | ICD-10-CM | POA: Diagnosis not present

## 2013-10-11 DIAGNOSIS — Z79899 Other long term (current) drug therapy: Secondary | ICD-10-CM | POA: Diagnosis not present

## 2013-10-11 DIAGNOSIS — L4050 Arthropathic psoriasis, unspecified: Secondary | ICD-10-CM | POA: Diagnosis not present

## 2013-10-11 DIAGNOSIS — L97809 Non-pressure chronic ulcer of other part of unspecified lower leg with unspecified severity: Secondary | ICD-10-CM | POA: Diagnosis not present

## 2013-10-11 DIAGNOSIS — L97209 Non-pressure chronic ulcer of unspecified calf with unspecified severity: Secondary | ICD-10-CM | POA: Diagnosis not present

## 2013-10-11 DIAGNOSIS — I890 Lymphedema, not elsewhere classified: Secondary | ICD-10-CM | POA: Diagnosis not present

## 2013-10-11 DIAGNOSIS — I872 Venous insufficiency (chronic) (peripheral): Secondary | ICD-10-CM | POA: Diagnosis not present

## 2013-10-11 DIAGNOSIS — I87319 Chronic venous hypertension (idiopathic) with ulcer of unspecified lower extremity: Secondary | ICD-10-CM | POA: Diagnosis not present

## 2013-10-11 DIAGNOSIS — L02419 Cutaneous abscess of limb, unspecified: Secondary | ICD-10-CM | POA: Diagnosis not present

## 2013-10-15 DIAGNOSIS — I110 Hypertensive heart disease with heart failure: Secondary | ICD-10-CM | POA: Diagnosis not present

## 2013-10-15 DIAGNOSIS — I872 Venous insufficiency (chronic) (peripheral): Secondary | ICD-10-CM | POA: Diagnosis not present

## 2013-10-15 DIAGNOSIS — I5021 Acute systolic (congestive) heart failure: Secondary | ICD-10-CM | POA: Diagnosis not present

## 2013-10-15 DIAGNOSIS — L02419 Cutaneous abscess of limb, unspecified: Secondary | ICD-10-CM | POA: Diagnosis present

## 2013-10-15 DIAGNOSIS — J811 Chronic pulmonary edema: Secondary | ICD-10-CM | POA: Diagnosis not present

## 2013-10-15 DIAGNOSIS — Z79899 Other long term (current) drug therapy: Secondary | ICD-10-CM | POA: Diagnosis not present

## 2013-10-15 DIAGNOSIS — I10 Essential (primary) hypertension: Secondary | ICD-10-CM | POA: Diagnosis not present

## 2013-10-15 DIAGNOSIS — I498 Other specified cardiac arrhythmias: Secondary | ICD-10-CM | POA: Diagnosis present

## 2013-10-15 DIAGNOSIS — I059 Rheumatic mitral valve disease, unspecified: Secondary | ICD-10-CM | POA: Diagnosis not present

## 2013-10-15 DIAGNOSIS — L03119 Cellulitis of unspecified part of limb: Secondary | ICD-10-CM | POA: Diagnosis present

## 2013-10-15 DIAGNOSIS — J9819 Other pulmonary collapse: Secondary | ICD-10-CM | POA: Diagnosis not present

## 2013-10-15 DIAGNOSIS — L4050 Arthropathic psoriasis, unspecified: Secondary | ICD-10-CM | POA: Diagnosis present

## 2013-10-15 DIAGNOSIS — G8929 Other chronic pain: Secondary | ICD-10-CM | POA: Diagnosis present

## 2013-10-15 DIAGNOSIS — Z880 Allergy status to penicillin: Secondary | ICD-10-CM | POA: Diagnosis not present

## 2013-10-15 DIAGNOSIS — G4733 Obstructive sleep apnea (adult) (pediatric): Secondary | ICD-10-CM | POA: Diagnosis present

## 2013-10-15 DIAGNOSIS — I4891 Unspecified atrial fibrillation: Secondary | ICD-10-CM | POA: Diagnosis not present

## 2013-10-15 DIAGNOSIS — M1710 Unilateral primary osteoarthritis, unspecified knee: Secondary | ICD-10-CM | POA: Diagnosis present

## 2013-10-15 DIAGNOSIS — L97909 Non-pressure chronic ulcer of unspecified part of unspecified lower leg with unspecified severity: Secondary | ICD-10-CM | POA: Diagnosis not present

## 2013-10-15 DIAGNOSIS — Z887 Allergy status to serum and vaccine status: Secondary | ICD-10-CM | POA: Diagnosis not present

## 2013-10-15 DIAGNOSIS — R319 Hematuria, unspecified: Secondary | ICD-10-CM | POA: Diagnosis present

## 2013-10-15 DIAGNOSIS — Z6841 Body Mass Index (BMI) 40.0 and over, adult: Secondary | ICD-10-CM | POA: Diagnosis not present

## 2013-10-15 DIAGNOSIS — L97309 Non-pressure chronic ulcer of unspecified ankle with unspecified severity: Secondary | ICD-10-CM | POA: Diagnosis present

## 2013-10-15 DIAGNOSIS — I7789 Other specified disorders of arteries and arterioles: Secondary | ICD-10-CM | POA: Diagnosis not present

## 2013-10-15 DIAGNOSIS — N320 Bladder-neck obstruction: Secondary | ICD-10-CM | POA: Diagnosis present

## 2013-10-15 DIAGNOSIS — I5031 Acute diastolic (congestive) heart failure: Secondary | ICD-10-CM | POA: Diagnosis present

## 2013-10-15 DIAGNOSIS — L97209 Non-pressure chronic ulcer of unspecified calf with unspecified severity: Secondary | ICD-10-CM | POA: Diagnosis not present

## 2013-10-15 DIAGNOSIS — N35919 Unspecified urethral stricture, male, unspecified site: Secondary | ICD-10-CM | POA: Diagnosis not present

## 2013-10-15 DIAGNOSIS — Z8249 Family history of ischemic heart disease and other diseases of the circulatory system: Secondary | ICD-10-CM | POA: Diagnosis not present

## 2013-10-15 DIAGNOSIS — E662 Morbid (severe) obesity with alveolar hypoventilation: Secondary | ICD-10-CM | POA: Diagnosis not present

## 2013-10-15 DIAGNOSIS — M5137 Other intervertebral disc degeneration, lumbosacral region: Secondary | ICD-10-CM | POA: Diagnosis present

## 2013-10-15 DIAGNOSIS — R0602 Shortness of breath: Secondary | ICD-10-CM | POA: Diagnosis not present

## 2013-10-15 DIAGNOSIS — R079 Chest pain, unspecified: Secondary | ICD-10-CM | POA: Diagnosis not present

## 2013-10-15 DIAGNOSIS — Z86718 Personal history of other venous thrombosis and embolism: Secondary | ICD-10-CM | POA: Diagnosis not present

## 2013-10-15 DIAGNOSIS — I509 Heart failure, unspecified: Secondary | ICD-10-CM | POA: Diagnosis not present

## 2013-10-15 DIAGNOSIS — I2789 Other specified pulmonary heart diseases: Secondary | ICD-10-CM | POA: Diagnosis not present

## 2013-10-26 DIAGNOSIS — I890 Lymphedema, not elsewhere classified: Secondary | ICD-10-CM | POA: Diagnosis not present

## 2013-10-26 DIAGNOSIS — I10 Essential (primary) hypertension: Secondary | ICD-10-CM | POA: Diagnosis not present

## 2013-10-26 DIAGNOSIS — I872 Venous insufficiency (chronic) (peripheral): Secondary | ICD-10-CM | POA: Diagnosis not present

## 2013-10-26 DIAGNOSIS — L02419 Cutaneous abscess of limb, unspecified: Secondary | ICD-10-CM | POA: Diagnosis not present

## 2013-10-26 DIAGNOSIS — L97809 Non-pressure chronic ulcer of other part of unspecified lower leg with unspecified severity: Secondary | ICD-10-CM | POA: Diagnosis not present

## 2013-10-26 DIAGNOSIS — L4050 Arthropathic psoriasis, unspecified: Secondary | ICD-10-CM | POA: Diagnosis not present

## 2013-10-26 DIAGNOSIS — L03119 Cellulitis of unspecified part of limb: Secondary | ICD-10-CM | POA: Diagnosis not present

## 2013-10-27 DIAGNOSIS — I10 Essential (primary) hypertension: Secondary | ICD-10-CM | POA: Diagnosis not present

## 2013-10-27 DIAGNOSIS — I890 Lymphedema, not elsewhere classified: Secondary | ICD-10-CM | POA: Diagnosis not present

## 2013-10-27 DIAGNOSIS — L97809 Non-pressure chronic ulcer of other part of unspecified lower leg with unspecified severity: Secondary | ICD-10-CM | POA: Diagnosis not present

## 2013-10-27 DIAGNOSIS — I872 Venous insufficiency (chronic) (peripheral): Secondary | ICD-10-CM | POA: Diagnosis not present

## 2013-10-27 DIAGNOSIS — L02419 Cutaneous abscess of limb, unspecified: Secondary | ICD-10-CM | POA: Diagnosis not present

## 2013-10-27 DIAGNOSIS — L4050 Arthropathic psoriasis, unspecified: Secondary | ICD-10-CM | POA: Diagnosis not present

## 2013-10-28 DIAGNOSIS — L02419 Cutaneous abscess of limb, unspecified: Secondary | ICD-10-CM | POA: Diagnosis not present

## 2013-10-28 DIAGNOSIS — I872 Venous insufficiency (chronic) (peripheral): Secondary | ICD-10-CM | POA: Diagnosis not present

## 2013-10-28 DIAGNOSIS — L97809 Non-pressure chronic ulcer of other part of unspecified lower leg with unspecified severity: Secondary | ICD-10-CM | POA: Diagnosis not present

## 2013-10-28 DIAGNOSIS — I10 Essential (primary) hypertension: Secondary | ICD-10-CM | POA: Diagnosis not present

## 2013-10-28 DIAGNOSIS — I890 Lymphedema, not elsewhere classified: Secondary | ICD-10-CM | POA: Diagnosis not present

## 2013-10-28 DIAGNOSIS — L4050 Arthropathic psoriasis, unspecified: Secondary | ICD-10-CM | POA: Diagnosis not present

## 2013-10-29 DIAGNOSIS — I872 Venous insufficiency (chronic) (peripheral): Secondary | ICD-10-CM | POA: Diagnosis not present

## 2013-10-29 DIAGNOSIS — I10 Essential (primary) hypertension: Secondary | ICD-10-CM | POA: Diagnosis not present

## 2013-10-29 DIAGNOSIS — I890 Lymphedema, not elsewhere classified: Secondary | ICD-10-CM | POA: Diagnosis not present

## 2013-10-29 DIAGNOSIS — L4050 Arthropathic psoriasis, unspecified: Secondary | ICD-10-CM | POA: Diagnosis not present

## 2013-10-29 DIAGNOSIS — L97809 Non-pressure chronic ulcer of other part of unspecified lower leg with unspecified severity: Secondary | ICD-10-CM | POA: Diagnosis not present

## 2013-10-29 DIAGNOSIS — L02419 Cutaneous abscess of limb, unspecified: Secondary | ICD-10-CM | POA: Diagnosis not present

## 2013-10-29 DIAGNOSIS — L03119 Cellulitis of unspecified part of limb: Secondary | ICD-10-CM | POA: Diagnosis not present

## 2013-10-30 DIAGNOSIS — I10 Essential (primary) hypertension: Secondary | ICD-10-CM | POA: Diagnosis not present

## 2013-10-30 DIAGNOSIS — L02419 Cutaneous abscess of limb, unspecified: Secondary | ICD-10-CM | POA: Diagnosis not present

## 2013-10-30 DIAGNOSIS — I872 Venous insufficiency (chronic) (peripheral): Secondary | ICD-10-CM | POA: Diagnosis not present

## 2013-10-30 DIAGNOSIS — L03119 Cellulitis of unspecified part of limb: Secondary | ICD-10-CM | POA: Diagnosis not present

## 2013-10-30 DIAGNOSIS — I890 Lymphedema, not elsewhere classified: Secondary | ICD-10-CM | POA: Diagnosis not present

## 2013-10-30 DIAGNOSIS — L97809 Non-pressure chronic ulcer of other part of unspecified lower leg with unspecified severity: Secondary | ICD-10-CM | POA: Diagnosis not present

## 2013-10-30 DIAGNOSIS — L4050 Arthropathic psoriasis, unspecified: Secondary | ICD-10-CM | POA: Diagnosis not present

## 2013-10-31 DIAGNOSIS — M545 Low back pain, unspecified: Secondary | ICD-10-CM | POA: Diagnosis not present

## 2013-10-31 DIAGNOSIS — I10 Essential (primary) hypertension: Secondary | ICD-10-CM | POA: Diagnosis not present

## 2013-10-31 DIAGNOSIS — M25569 Pain in unspecified knee: Secondary | ICD-10-CM | POA: Diagnosis not present

## 2013-11-01 DIAGNOSIS — I872 Venous insufficiency (chronic) (peripheral): Secondary | ICD-10-CM | POA: Diagnosis not present

## 2013-11-01 DIAGNOSIS — L97809 Non-pressure chronic ulcer of other part of unspecified lower leg with unspecified severity: Secondary | ICD-10-CM | POA: Diagnosis not present

## 2013-11-01 DIAGNOSIS — L02419 Cutaneous abscess of limb, unspecified: Secondary | ICD-10-CM | POA: Diagnosis not present

## 2013-11-01 DIAGNOSIS — I890 Lymphedema, not elsewhere classified: Secondary | ICD-10-CM | POA: Diagnosis not present

## 2013-11-01 DIAGNOSIS — I10 Essential (primary) hypertension: Secondary | ICD-10-CM | POA: Diagnosis not present

## 2013-11-01 DIAGNOSIS — L4050 Arthropathic psoriasis, unspecified: Secondary | ICD-10-CM | POA: Diagnosis not present

## 2013-11-02 DIAGNOSIS — I10 Essential (primary) hypertension: Secondary | ICD-10-CM | POA: Diagnosis not present

## 2013-11-02 DIAGNOSIS — L03119 Cellulitis of unspecified part of limb: Secondary | ICD-10-CM | POA: Diagnosis not present

## 2013-11-02 DIAGNOSIS — I872 Venous insufficiency (chronic) (peripheral): Secondary | ICD-10-CM | POA: Diagnosis not present

## 2013-11-02 DIAGNOSIS — L4050 Arthropathic psoriasis, unspecified: Secondary | ICD-10-CM | POA: Diagnosis not present

## 2013-11-02 DIAGNOSIS — L97809 Non-pressure chronic ulcer of other part of unspecified lower leg with unspecified severity: Secondary | ICD-10-CM | POA: Diagnosis not present

## 2013-11-02 DIAGNOSIS — L02419 Cutaneous abscess of limb, unspecified: Secondary | ICD-10-CM | POA: Diagnosis not present

## 2013-11-02 DIAGNOSIS — I890 Lymphedema, not elsewhere classified: Secondary | ICD-10-CM | POA: Diagnosis not present

## 2013-11-05 DIAGNOSIS — I890 Lymphedema, not elsewhere classified: Secondary | ICD-10-CM | POA: Diagnosis not present

## 2013-11-05 DIAGNOSIS — L4050 Arthropathic psoriasis, unspecified: Secondary | ICD-10-CM | POA: Diagnosis not present

## 2013-11-05 DIAGNOSIS — I10 Essential (primary) hypertension: Secondary | ICD-10-CM | POA: Diagnosis not present

## 2013-11-05 DIAGNOSIS — L02419 Cutaneous abscess of limb, unspecified: Secondary | ICD-10-CM | POA: Diagnosis not present

## 2013-11-05 DIAGNOSIS — I872 Venous insufficiency (chronic) (peripheral): Secondary | ICD-10-CM | POA: Diagnosis not present

## 2013-11-05 DIAGNOSIS — L97809 Non-pressure chronic ulcer of other part of unspecified lower leg with unspecified severity: Secondary | ICD-10-CM | POA: Diagnosis not present

## 2013-11-05 DIAGNOSIS — R21 Rash and other nonspecific skin eruption: Secondary | ICD-10-CM | POA: Diagnosis not present

## 2013-11-06 DIAGNOSIS — I872 Venous insufficiency (chronic) (peripheral): Secondary | ICD-10-CM | POA: Diagnosis not present

## 2013-11-06 DIAGNOSIS — I10 Essential (primary) hypertension: Secondary | ICD-10-CM | POA: Diagnosis not present

## 2013-11-06 DIAGNOSIS — I890 Lymphedema, not elsewhere classified: Secondary | ICD-10-CM | POA: Diagnosis not present

## 2013-11-06 DIAGNOSIS — L97809 Non-pressure chronic ulcer of other part of unspecified lower leg with unspecified severity: Secondary | ICD-10-CM | POA: Diagnosis not present

## 2013-11-06 DIAGNOSIS — L4050 Arthropathic psoriasis, unspecified: Secondary | ICD-10-CM | POA: Diagnosis not present

## 2013-11-06 DIAGNOSIS — L02419 Cutaneous abscess of limb, unspecified: Secondary | ICD-10-CM | POA: Diagnosis not present

## 2013-11-07 DIAGNOSIS — L97809 Non-pressure chronic ulcer of other part of unspecified lower leg with unspecified severity: Secondary | ICD-10-CM | POA: Diagnosis not present

## 2013-11-07 DIAGNOSIS — L02419 Cutaneous abscess of limb, unspecified: Secondary | ICD-10-CM | POA: Diagnosis not present

## 2013-11-07 DIAGNOSIS — L03119 Cellulitis of unspecified part of limb: Secondary | ICD-10-CM | POA: Diagnosis not present

## 2013-11-07 DIAGNOSIS — L4050 Arthropathic psoriasis, unspecified: Secondary | ICD-10-CM | POA: Diagnosis not present

## 2013-11-07 DIAGNOSIS — I890 Lymphedema, not elsewhere classified: Secondary | ICD-10-CM | POA: Diagnosis not present

## 2013-11-07 DIAGNOSIS — I872 Venous insufficiency (chronic) (peripheral): Secondary | ICD-10-CM | POA: Diagnosis not present

## 2013-11-07 DIAGNOSIS — I10 Essential (primary) hypertension: Secondary | ICD-10-CM | POA: Diagnosis not present

## 2013-11-09 DIAGNOSIS — L4050 Arthropathic psoriasis, unspecified: Secondary | ICD-10-CM | POA: Diagnosis not present

## 2013-11-09 DIAGNOSIS — I10 Essential (primary) hypertension: Secondary | ICD-10-CM | POA: Diagnosis not present

## 2013-11-09 DIAGNOSIS — L02419 Cutaneous abscess of limb, unspecified: Secondary | ICD-10-CM | POA: Diagnosis not present

## 2013-11-09 DIAGNOSIS — I872 Venous insufficiency (chronic) (peripheral): Secondary | ICD-10-CM | POA: Diagnosis not present

## 2013-11-09 DIAGNOSIS — I890 Lymphedema, not elsewhere classified: Secondary | ICD-10-CM | POA: Diagnosis not present

## 2013-11-09 DIAGNOSIS — L97809 Non-pressure chronic ulcer of other part of unspecified lower leg with unspecified severity: Secondary | ICD-10-CM | POA: Diagnosis not present

## 2013-11-13 DIAGNOSIS — I10 Essential (primary) hypertension: Secondary | ICD-10-CM | POA: Diagnosis not present

## 2013-11-13 DIAGNOSIS — I872 Venous insufficiency (chronic) (peripheral): Secondary | ICD-10-CM | POA: Diagnosis not present

## 2013-11-13 DIAGNOSIS — L02419 Cutaneous abscess of limb, unspecified: Secondary | ICD-10-CM | POA: Diagnosis not present

## 2013-11-13 DIAGNOSIS — L4050 Arthropathic psoriasis, unspecified: Secondary | ICD-10-CM | POA: Diagnosis not present

## 2013-11-13 DIAGNOSIS — I890 Lymphedema, not elsewhere classified: Secondary | ICD-10-CM | POA: Diagnosis not present

## 2013-11-13 DIAGNOSIS — L97809 Non-pressure chronic ulcer of other part of unspecified lower leg with unspecified severity: Secondary | ICD-10-CM | POA: Diagnosis not present

## 2013-11-14 DIAGNOSIS — L97809 Non-pressure chronic ulcer of other part of unspecified lower leg with unspecified severity: Secondary | ICD-10-CM | POA: Diagnosis not present

## 2013-11-14 DIAGNOSIS — I890 Lymphedema, not elsewhere classified: Secondary | ICD-10-CM | POA: Diagnosis not present

## 2013-11-14 DIAGNOSIS — L03119 Cellulitis of unspecified part of limb: Secondary | ICD-10-CM | POA: Diagnosis not present

## 2013-11-14 DIAGNOSIS — L4050 Arthropathic psoriasis, unspecified: Secondary | ICD-10-CM | POA: Diagnosis not present

## 2013-11-14 DIAGNOSIS — I872 Venous insufficiency (chronic) (peripheral): Secondary | ICD-10-CM | POA: Diagnosis not present

## 2013-11-14 DIAGNOSIS — I10 Essential (primary) hypertension: Secondary | ICD-10-CM | POA: Diagnosis not present

## 2013-11-14 DIAGNOSIS — L02419 Cutaneous abscess of limb, unspecified: Secondary | ICD-10-CM | POA: Diagnosis not present

## 2013-11-16 DIAGNOSIS — L02419 Cutaneous abscess of limb, unspecified: Secondary | ICD-10-CM | POA: Diagnosis not present

## 2013-11-16 DIAGNOSIS — I872 Venous insufficiency (chronic) (peripheral): Secondary | ICD-10-CM | POA: Diagnosis not present

## 2013-11-16 DIAGNOSIS — L97809 Non-pressure chronic ulcer of other part of unspecified lower leg with unspecified severity: Secondary | ICD-10-CM | POA: Diagnosis not present

## 2013-11-16 DIAGNOSIS — L03119 Cellulitis of unspecified part of limb: Secondary | ICD-10-CM | POA: Diagnosis not present

## 2013-11-16 DIAGNOSIS — I890 Lymphedema, not elsewhere classified: Secondary | ICD-10-CM | POA: Diagnosis not present

## 2013-11-16 DIAGNOSIS — L4050 Arthropathic psoriasis, unspecified: Secondary | ICD-10-CM | POA: Diagnosis not present

## 2013-11-16 DIAGNOSIS — I10 Essential (primary) hypertension: Secondary | ICD-10-CM | POA: Diagnosis not present

## 2013-11-20 DIAGNOSIS — I872 Venous insufficiency (chronic) (peripheral): Secondary | ICD-10-CM | POA: Diagnosis not present

## 2013-11-20 DIAGNOSIS — L97809 Non-pressure chronic ulcer of other part of unspecified lower leg with unspecified severity: Secondary | ICD-10-CM | POA: Diagnosis not present

## 2013-11-20 DIAGNOSIS — M5137 Other intervertebral disc degeneration, lumbosacral region: Secondary | ICD-10-CM | POA: Diagnosis not present

## 2013-11-20 DIAGNOSIS — I890 Lymphedema, not elsewhere classified: Secondary | ICD-10-CM | POA: Diagnosis not present

## 2013-11-20 DIAGNOSIS — M1710 Unilateral primary osteoarthritis, unspecified knee: Secondary | ICD-10-CM | POA: Diagnosis not present

## 2013-11-20 DIAGNOSIS — I509 Heart failure, unspecified: Secondary | ICD-10-CM | POA: Diagnosis not present

## 2013-11-20 DIAGNOSIS — I5031 Acute diastolic (congestive) heart failure: Secondary | ICD-10-CM | POA: Diagnosis not present

## 2013-11-20 DIAGNOSIS — L4050 Arthropathic psoriasis, unspecified: Secondary | ICD-10-CM | POA: Diagnosis not present

## 2013-11-20 DIAGNOSIS — I10 Essential (primary) hypertension: Secondary | ICD-10-CM | POA: Diagnosis not present

## 2013-11-20 DIAGNOSIS — G4733 Obstructive sleep apnea (adult) (pediatric): Secondary | ICD-10-CM | POA: Diagnosis not present

## 2013-11-20 DIAGNOSIS — E662 Morbid (severe) obesity with alveolar hypoventilation: Secondary | ICD-10-CM | POA: Diagnosis not present

## 2013-11-24 DIAGNOSIS — I10 Essential (primary) hypertension: Secondary | ICD-10-CM | POA: Diagnosis not present

## 2013-11-24 DIAGNOSIS — M1710 Unilateral primary osteoarthritis, unspecified knee: Secondary | ICD-10-CM | POA: Diagnosis not present

## 2013-11-24 DIAGNOSIS — L97809 Non-pressure chronic ulcer of other part of unspecified lower leg with unspecified severity: Secondary | ICD-10-CM | POA: Diagnosis not present

## 2013-11-24 DIAGNOSIS — M5137 Other intervertebral disc degeneration, lumbosacral region: Secondary | ICD-10-CM | POA: Diagnosis not present

## 2013-11-24 DIAGNOSIS — I872 Venous insufficiency (chronic) (peripheral): Secondary | ICD-10-CM | POA: Diagnosis not present

## 2013-11-24 DIAGNOSIS — I890 Lymphedema, not elsewhere classified: Secondary | ICD-10-CM | POA: Diagnosis not present

## 2013-11-30 DIAGNOSIS — M1710 Unilateral primary osteoarthritis, unspecified knee: Secondary | ICD-10-CM | POA: Diagnosis not present

## 2013-11-30 DIAGNOSIS — I872 Venous insufficiency (chronic) (peripheral): Secondary | ICD-10-CM | POA: Diagnosis not present

## 2013-11-30 DIAGNOSIS — I10 Essential (primary) hypertension: Secondary | ICD-10-CM | POA: Diagnosis not present

## 2013-11-30 DIAGNOSIS — L97809 Non-pressure chronic ulcer of other part of unspecified lower leg with unspecified severity: Secondary | ICD-10-CM | POA: Diagnosis not present

## 2013-11-30 DIAGNOSIS — M5137 Other intervertebral disc degeneration, lumbosacral region: Secondary | ICD-10-CM | POA: Diagnosis not present

## 2013-11-30 DIAGNOSIS — I890 Lymphedema, not elsewhere classified: Secondary | ICD-10-CM | POA: Diagnosis not present

## 2013-12-04 DIAGNOSIS — M545 Low back pain, unspecified: Secondary | ICD-10-CM | POA: Diagnosis not present

## 2013-12-04 DIAGNOSIS — L4050 Arthropathic psoriasis, unspecified: Secondary | ICD-10-CM | POA: Diagnosis not present

## 2013-12-04 DIAGNOSIS — M159 Polyosteoarthritis, unspecified: Secondary | ICD-10-CM | POA: Diagnosis not present

## 2013-12-04 DIAGNOSIS — M5137 Other intervertebral disc degeneration, lumbosacral region: Secondary | ICD-10-CM | POA: Diagnosis not present

## 2013-12-04 DIAGNOSIS — G894 Chronic pain syndrome: Secondary | ICD-10-CM | POA: Diagnosis not present

## 2013-12-05 DIAGNOSIS — R21 Rash and other nonspecific skin eruption: Secondary | ICD-10-CM | POA: Diagnosis not present

## 2013-12-05 DIAGNOSIS — I10 Essential (primary) hypertension: Secondary | ICD-10-CM | POA: Diagnosis not present

## 2013-12-05 DIAGNOSIS — L299 Pruritus, unspecified: Secondary | ICD-10-CM | POA: Diagnosis not present

## 2013-12-07 DIAGNOSIS — I872 Venous insufficiency (chronic) (peripheral): Secondary | ICD-10-CM | POA: Diagnosis not present

## 2013-12-07 DIAGNOSIS — L97809 Non-pressure chronic ulcer of other part of unspecified lower leg with unspecified severity: Secondary | ICD-10-CM | POA: Diagnosis not present

## 2013-12-07 DIAGNOSIS — M1710 Unilateral primary osteoarthritis, unspecified knee: Secondary | ICD-10-CM | POA: Diagnosis not present

## 2013-12-07 DIAGNOSIS — I10 Essential (primary) hypertension: Secondary | ICD-10-CM | POA: Diagnosis not present

## 2013-12-07 DIAGNOSIS — I890 Lymphedema, not elsewhere classified: Secondary | ICD-10-CM | POA: Diagnosis not present

## 2013-12-07 DIAGNOSIS — M5137 Other intervertebral disc degeneration, lumbosacral region: Secondary | ICD-10-CM | POA: Diagnosis not present

## 2013-12-12 DIAGNOSIS — I890 Lymphedema, not elsewhere classified: Secondary | ICD-10-CM | POA: Diagnosis not present

## 2013-12-12 DIAGNOSIS — I10 Essential (primary) hypertension: Secondary | ICD-10-CM | POA: Diagnosis not present

## 2013-12-12 DIAGNOSIS — L97809 Non-pressure chronic ulcer of other part of unspecified lower leg with unspecified severity: Secondary | ICD-10-CM | POA: Diagnosis not present

## 2013-12-12 DIAGNOSIS — I872 Venous insufficiency (chronic) (peripheral): Secondary | ICD-10-CM | POA: Diagnosis not present

## 2013-12-12 DIAGNOSIS — M1710 Unilateral primary osteoarthritis, unspecified knee: Secondary | ICD-10-CM | POA: Diagnosis not present

## 2013-12-12 DIAGNOSIS — M5137 Other intervertebral disc degeneration, lumbosacral region: Secondary | ICD-10-CM | POA: Diagnosis not present

## 2013-12-21 DIAGNOSIS — M5137 Other intervertebral disc degeneration, lumbosacral region: Secondary | ICD-10-CM | POA: Diagnosis not present

## 2013-12-21 DIAGNOSIS — I890 Lymphedema, not elsewhere classified: Secondary | ICD-10-CM | POA: Diagnosis not present

## 2013-12-21 DIAGNOSIS — I872 Venous insufficiency (chronic) (peripheral): Secondary | ICD-10-CM | POA: Diagnosis not present

## 2013-12-21 DIAGNOSIS — I10 Essential (primary) hypertension: Secondary | ICD-10-CM | POA: Diagnosis not present

## 2013-12-21 DIAGNOSIS — M1710 Unilateral primary osteoarthritis, unspecified knee: Secondary | ICD-10-CM | POA: Diagnosis not present

## 2013-12-21 DIAGNOSIS — L97809 Non-pressure chronic ulcer of other part of unspecified lower leg with unspecified severity: Secondary | ICD-10-CM | POA: Diagnosis not present

## 2014-01-01 DIAGNOSIS — M545 Low back pain, unspecified: Secondary | ICD-10-CM | POA: Diagnosis not present

## 2014-01-01 DIAGNOSIS — M5137 Other intervertebral disc degeneration, lumbosacral region: Secondary | ICD-10-CM | POA: Diagnosis not present

## 2014-01-01 DIAGNOSIS — M159 Polyosteoarthritis, unspecified: Secondary | ICD-10-CM | POA: Diagnosis not present

## 2014-01-01 DIAGNOSIS — L4050 Arthropathic psoriasis, unspecified: Secondary | ICD-10-CM | POA: Diagnosis not present

## 2014-01-01 DIAGNOSIS — G894 Chronic pain syndrome: Secondary | ICD-10-CM | POA: Diagnosis not present

## 2014-01-01 DIAGNOSIS — M25569 Pain in unspecified knee: Secondary | ICD-10-CM | POA: Diagnosis not present

## 2014-01-29 DIAGNOSIS — M25569 Pain in unspecified knee: Secondary | ICD-10-CM | POA: Diagnosis not present

## 2014-01-29 DIAGNOSIS — G894 Chronic pain syndrome: Secondary | ICD-10-CM | POA: Diagnosis not present

## 2014-01-29 DIAGNOSIS — M159 Polyosteoarthritis, unspecified: Secondary | ICD-10-CM | POA: Diagnosis not present

## 2014-01-29 DIAGNOSIS — M545 Low back pain, unspecified: Secondary | ICD-10-CM | POA: Diagnosis not present

## 2014-01-29 DIAGNOSIS — M5137 Other intervertebral disc degeneration, lumbosacral region: Secondary | ICD-10-CM | POA: Diagnosis not present

## 2014-01-29 DIAGNOSIS — L4050 Arthropathic psoriasis, unspecified: Secondary | ICD-10-CM | POA: Diagnosis not present

## 2014-02-06 DIAGNOSIS — I10 Essential (primary) hypertension: Secondary | ICD-10-CM | POA: Diagnosis not present

## 2014-02-06 DIAGNOSIS — F4323 Adjustment disorder with mixed anxiety and depressed mood: Secondary | ICD-10-CM | POA: Diagnosis not present

## 2014-02-19 DIAGNOSIS — H66009 Acute suppurative otitis media without spontaneous rupture of ear drum, unspecified ear: Secondary | ICD-10-CM | POA: Diagnosis not present

## 2014-02-19 DIAGNOSIS — R5381 Other malaise: Secondary | ICD-10-CM | POA: Diagnosis not present

## 2014-02-19 DIAGNOSIS — L03119 Cellulitis of unspecified part of limb: Secondary | ICD-10-CM | POA: Diagnosis not present

## 2014-02-19 DIAGNOSIS — L02419 Cutaneous abscess of limb, unspecified: Secondary | ICD-10-CM | POA: Diagnosis not present

## 2014-02-19 DIAGNOSIS — R5383 Other fatigue: Secondary | ICD-10-CM | POA: Diagnosis not present

## 2014-03-07 DIAGNOSIS — M545 Low back pain, unspecified: Secondary | ICD-10-CM | POA: Diagnosis not present

## 2014-03-07 DIAGNOSIS — G894 Chronic pain syndrome: Secondary | ICD-10-CM | POA: Diagnosis not present

## 2014-03-07 DIAGNOSIS — M159 Polyosteoarthritis, unspecified: Secondary | ICD-10-CM | POA: Diagnosis not present

## 2014-03-07 DIAGNOSIS — L4050 Arthropathic psoriasis, unspecified: Secondary | ICD-10-CM | POA: Diagnosis not present

## 2014-03-07 DIAGNOSIS — M1710 Unilateral primary osteoarthritis, unspecified knee: Secondary | ICD-10-CM | POA: Diagnosis not present

## 2014-03-07 DIAGNOSIS — IMO0002 Reserved for concepts with insufficient information to code with codable children: Secondary | ICD-10-CM | POA: Diagnosis not present

## 2014-03-07 DIAGNOSIS — M5137 Other intervertebral disc degeneration, lumbosacral region: Secondary | ICD-10-CM | POA: Diagnosis not present

## 2014-03-07 DIAGNOSIS — M25569 Pain in unspecified knee: Secondary | ICD-10-CM | POA: Diagnosis not present

## 2014-04-04 DIAGNOSIS — M545 Low back pain, unspecified: Secondary | ICD-10-CM | POA: Diagnosis not present

## 2014-04-04 DIAGNOSIS — G4700 Insomnia, unspecified: Secondary | ICD-10-CM | POA: Diagnosis not present

## 2014-04-04 DIAGNOSIS — M159 Polyosteoarthritis, unspecified: Secondary | ICD-10-CM | POA: Diagnosis not present

## 2014-04-04 DIAGNOSIS — G894 Chronic pain syndrome: Secondary | ICD-10-CM | POA: Diagnosis not present

## 2014-04-04 DIAGNOSIS — I4891 Unspecified atrial fibrillation: Secondary | ICD-10-CM | POA: Diagnosis not present

## 2014-04-04 DIAGNOSIS — M25569 Pain in unspecified knee: Secondary | ICD-10-CM | POA: Diagnosis not present

## 2014-04-04 DIAGNOSIS — I10 Essential (primary) hypertension: Secondary | ICD-10-CM | POA: Diagnosis not present

## 2014-04-04 DIAGNOSIS — M1710 Unilateral primary osteoarthritis, unspecified knee: Secondary | ICD-10-CM | POA: Diagnosis not present

## 2014-04-04 DIAGNOSIS — L4050 Arthropathic psoriasis, unspecified: Secondary | ICD-10-CM | POA: Diagnosis not present

## 2014-04-04 DIAGNOSIS — M5137 Other intervertebral disc degeneration, lumbosacral region: Secondary | ICD-10-CM | POA: Diagnosis not present

## 2014-04-04 DIAGNOSIS — M129 Arthropathy, unspecified: Secondary | ICD-10-CM | POA: Diagnosis not present

## 2014-04-04 DIAGNOSIS — IMO0002 Reserved for concepts with insufficient information to code with codable children: Secondary | ICD-10-CM | POA: Diagnosis not present

## 2014-06-24 DIAGNOSIS — F3340 Major depressive disorder, recurrent, in remission, unspecified: Secondary | ICD-10-CM | POA: Diagnosis not present

## 2014-06-24 DIAGNOSIS — M545 Low back pain: Secondary | ICD-10-CM | POA: Diagnosis not present

## 2014-06-24 DIAGNOSIS — I4891 Unspecified atrial fibrillation: Secondary | ICD-10-CM | POA: Diagnosis not present

## 2014-06-24 DIAGNOSIS — I10 Essential (primary) hypertension: Secondary | ICD-10-CM | POA: Diagnosis not present

## 2014-08-27 DIAGNOSIS — I10 Essential (primary) hypertension: Secondary | ICD-10-CM | POA: Diagnosis not present

## 2014-08-27 DIAGNOSIS — Z6841 Body Mass Index (BMI) 40.0 and over, adult: Secondary | ICD-10-CM | POA: Diagnosis not present

## 2014-08-27 DIAGNOSIS — M549 Dorsalgia, unspecified: Secondary | ICD-10-CM | POA: Diagnosis not present

## 2014-08-27 DIAGNOSIS — Z79899 Other long term (current) drug therapy: Secondary | ICD-10-CM | POA: Diagnosis not present

## 2014-08-27 DIAGNOSIS — L409 Psoriasis, unspecified: Secondary | ICD-10-CM | POA: Diagnosis not present

## 2014-08-27 DIAGNOSIS — I83009 Varicose veins of unspecified lower extremity with ulcer of unspecified site: Secondary | ICD-10-CM | POA: Diagnosis not present

## 2014-08-27 DIAGNOSIS — M1990 Unspecified osteoarthritis, unspecified site: Secondary | ICD-10-CM | POA: Diagnosis not present

## 2014-08-30 DIAGNOSIS — L97911 Non-pressure chronic ulcer of unspecified part of right lower leg limited to breakdown of skin: Secondary | ICD-10-CM | POA: Diagnosis not present

## 2014-08-30 DIAGNOSIS — L97921 Non-pressure chronic ulcer of unspecified part of left lower leg limited to breakdown of skin: Secondary | ICD-10-CM | POA: Diagnosis not present

## 2014-08-30 DIAGNOSIS — E669 Obesity, unspecified: Secondary | ICD-10-CM | POA: Diagnosis not present

## 2014-08-30 DIAGNOSIS — L409 Psoriasis, unspecified: Secondary | ICD-10-CM | POA: Diagnosis not present

## 2014-09-02 DIAGNOSIS — L97911 Non-pressure chronic ulcer of unspecified part of right lower leg limited to breakdown of skin: Secondary | ICD-10-CM | POA: Diagnosis not present

## 2014-09-02 DIAGNOSIS — E669 Obesity, unspecified: Secondary | ICD-10-CM | POA: Diagnosis not present

## 2014-09-02 DIAGNOSIS — L97921 Non-pressure chronic ulcer of unspecified part of left lower leg limited to breakdown of skin: Secondary | ICD-10-CM | POA: Diagnosis not present

## 2014-09-02 DIAGNOSIS — L409 Psoriasis, unspecified: Secondary | ICD-10-CM | POA: Diagnosis not present

## 2014-09-03 DIAGNOSIS — L409 Psoriasis, unspecified: Secondary | ICD-10-CM | POA: Diagnosis not present

## 2014-09-03 DIAGNOSIS — L97921 Non-pressure chronic ulcer of unspecified part of left lower leg limited to breakdown of skin: Secondary | ICD-10-CM | POA: Diagnosis not present

## 2014-09-03 DIAGNOSIS — E669 Obesity, unspecified: Secondary | ICD-10-CM | POA: Diagnosis not present

## 2014-09-03 DIAGNOSIS — L97911 Non-pressure chronic ulcer of unspecified part of right lower leg limited to breakdown of skin: Secondary | ICD-10-CM | POA: Diagnosis not present

## 2014-09-04 DIAGNOSIS — L97921 Non-pressure chronic ulcer of unspecified part of left lower leg limited to breakdown of skin: Secondary | ICD-10-CM | POA: Diagnosis not present

## 2014-09-04 DIAGNOSIS — L409 Psoriasis, unspecified: Secondary | ICD-10-CM | POA: Diagnosis not present

## 2014-09-04 DIAGNOSIS — E669 Obesity, unspecified: Secondary | ICD-10-CM | POA: Diagnosis not present

## 2014-09-04 DIAGNOSIS — L97911 Non-pressure chronic ulcer of unspecified part of right lower leg limited to breakdown of skin: Secondary | ICD-10-CM | POA: Diagnosis not present

## 2014-09-06 DIAGNOSIS — L409 Psoriasis, unspecified: Secondary | ICD-10-CM | POA: Diagnosis not present

## 2014-09-06 DIAGNOSIS — L97921 Non-pressure chronic ulcer of unspecified part of left lower leg limited to breakdown of skin: Secondary | ICD-10-CM | POA: Diagnosis not present

## 2014-09-06 DIAGNOSIS — E669 Obesity, unspecified: Secondary | ICD-10-CM | POA: Diagnosis not present

## 2014-09-06 DIAGNOSIS — L97911 Non-pressure chronic ulcer of unspecified part of right lower leg limited to breakdown of skin: Secondary | ICD-10-CM | POA: Diagnosis not present

## 2014-09-09 DIAGNOSIS — L97911 Non-pressure chronic ulcer of unspecified part of right lower leg limited to breakdown of skin: Secondary | ICD-10-CM | POA: Diagnosis not present

## 2014-09-09 DIAGNOSIS — E669 Obesity, unspecified: Secondary | ICD-10-CM | POA: Diagnosis not present

## 2014-09-09 DIAGNOSIS — L97921 Non-pressure chronic ulcer of unspecified part of left lower leg limited to breakdown of skin: Secondary | ICD-10-CM | POA: Diagnosis not present

## 2014-09-09 DIAGNOSIS — L409 Psoriasis, unspecified: Secondary | ICD-10-CM | POA: Diagnosis not present

## 2014-09-10 DIAGNOSIS — E291 Testicular hypofunction: Secondary | ICD-10-CM | POA: Diagnosis not present

## 2014-09-10 DIAGNOSIS — E669 Obesity, unspecified: Secondary | ICD-10-CM | POA: Diagnosis not present

## 2014-09-10 DIAGNOSIS — Z79899 Other long term (current) drug therapy: Secondary | ICD-10-CM | POA: Diagnosis not present

## 2014-09-10 DIAGNOSIS — M549 Dorsalgia, unspecified: Secondary | ICD-10-CM | POA: Diagnosis not present

## 2014-09-10 DIAGNOSIS — I83009 Varicose veins of unspecified lower extremity with ulcer of unspecified site: Secondary | ICD-10-CM | POA: Diagnosis not present

## 2014-09-10 DIAGNOSIS — L97921 Non-pressure chronic ulcer of unspecified part of left lower leg limited to breakdown of skin: Secondary | ICD-10-CM | POA: Diagnosis not present

## 2014-09-10 DIAGNOSIS — L409 Psoriasis, unspecified: Secondary | ICD-10-CM | POA: Diagnosis not present

## 2014-09-10 DIAGNOSIS — I4891 Unspecified atrial fibrillation: Secondary | ICD-10-CM | POA: Diagnosis not present

## 2014-09-10 DIAGNOSIS — I10 Essential (primary) hypertension: Secondary | ICD-10-CM | POA: Diagnosis not present

## 2014-09-10 DIAGNOSIS — L97911 Non-pressure chronic ulcer of unspecified part of right lower leg limited to breakdown of skin: Secondary | ICD-10-CM | POA: Diagnosis not present

## 2014-09-10 DIAGNOSIS — Z1389 Encounter for screening for other disorder: Secondary | ICD-10-CM | POA: Diagnosis not present

## 2014-09-11 DIAGNOSIS — E669 Obesity, unspecified: Secondary | ICD-10-CM | POA: Diagnosis not present

## 2014-09-11 DIAGNOSIS — L97911 Non-pressure chronic ulcer of unspecified part of right lower leg limited to breakdown of skin: Secondary | ICD-10-CM | POA: Diagnosis not present

## 2014-09-11 DIAGNOSIS — L97921 Non-pressure chronic ulcer of unspecified part of left lower leg limited to breakdown of skin: Secondary | ICD-10-CM | POA: Diagnosis not present

## 2014-09-11 DIAGNOSIS — L409 Psoriasis, unspecified: Secondary | ICD-10-CM | POA: Diagnosis not present

## 2014-09-12 DIAGNOSIS — L97921 Non-pressure chronic ulcer of unspecified part of left lower leg limited to breakdown of skin: Secondary | ICD-10-CM | POA: Diagnosis not present

## 2014-09-12 DIAGNOSIS — L97911 Non-pressure chronic ulcer of unspecified part of right lower leg limited to breakdown of skin: Secondary | ICD-10-CM | POA: Diagnosis not present

## 2014-09-12 DIAGNOSIS — L409 Psoriasis, unspecified: Secondary | ICD-10-CM | POA: Diagnosis not present

## 2014-09-12 DIAGNOSIS — E669 Obesity, unspecified: Secondary | ICD-10-CM | POA: Diagnosis not present

## 2014-09-13 DIAGNOSIS — E669 Obesity, unspecified: Secondary | ICD-10-CM | POA: Diagnosis not present

## 2014-09-13 DIAGNOSIS — L97911 Non-pressure chronic ulcer of unspecified part of right lower leg limited to breakdown of skin: Secondary | ICD-10-CM | POA: Diagnosis not present

## 2014-09-13 DIAGNOSIS — L409 Psoriasis, unspecified: Secondary | ICD-10-CM | POA: Diagnosis not present

## 2014-09-13 DIAGNOSIS — L97921 Non-pressure chronic ulcer of unspecified part of left lower leg limited to breakdown of skin: Secondary | ICD-10-CM | POA: Diagnosis not present

## 2014-09-16 DIAGNOSIS — E669 Obesity, unspecified: Secondary | ICD-10-CM | POA: Diagnosis not present

## 2014-09-16 DIAGNOSIS — L409 Psoriasis, unspecified: Secondary | ICD-10-CM | POA: Diagnosis not present

## 2014-09-16 DIAGNOSIS — L97921 Non-pressure chronic ulcer of unspecified part of left lower leg limited to breakdown of skin: Secondary | ICD-10-CM | POA: Diagnosis not present

## 2014-09-16 DIAGNOSIS — L97911 Non-pressure chronic ulcer of unspecified part of right lower leg limited to breakdown of skin: Secondary | ICD-10-CM | POA: Diagnosis not present

## 2014-09-18 DIAGNOSIS — I4891 Unspecified atrial fibrillation: Secondary | ICD-10-CM | POA: Diagnosis not present

## 2014-09-19 DIAGNOSIS — L409 Psoriasis, unspecified: Secondary | ICD-10-CM | POA: Diagnosis not present

## 2014-09-19 DIAGNOSIS — E669 Obesity, unspecified: Secondary | ICD-10-CM | POA: Diagnosis not present

## 2014-09-19 DIAGNOSIS — L97911 Non-pressure chronic ulcer of unspecified part of right lower leg limited to breakdown of skin: Secondary | ICD-10-CM | POA: Diagnosis not present

## 2014-09-19 DIAGNOSIS — L97921 Non-pressure chronic ulcer of unspecified part of left lower leg limited to breakdown of skin: Secondary | ICD-10-CM | POA: Diagnosis not present

## 2014-09-23 DIAGNOSIS — L97911 Non-pressure chronic ulcer of unspecified part of right lower leg limited to breakdown of skin: Secondary | ICD-10-CM | POA: Diagnosis not present

## 2014-09-23 DIAGNOSIS — L97921 Non-pressure chronic ulcer of unspecified part of left lower leg limited to breakdown of skin: Secondary | ICD-10-CM | POA: Diagnosis not present

## 2014-09-23 DIAGNOSIS — L409 Psoriasis, unspecified: Secondary | ICD-10-CM | POA: Diagnosis not present

## 2014-09-23 DIAGNOSIS — E669 Obesity, unspecified: Secondary | ICD-10-CM | POA: Diagnosis not present

## 2014-09-26 DIAGNOSIS — I4891 Unspecified atrial fibrillation: Secondary | ICD-10-CM | POA: Diagnosis not present

## 2014-09-27 DIAGNOSIS — E669 Obesity, unspecified: Secondary | ICD-10-CM | POA: Diagnosis not present

## 2014-09-27 DIAGNOSIS — L97911 Non-pressure chronic ulcer of unspecified part of right lower leg limited to breakdown of skin: Secondary | ICD-10-CM | POA: Diagnosis not present

## 2014-09-27 DIAGNOSIS — L409 Psoriasis, unspecified: Secondary | ICD-10-CM | POA: Diagnosis not present

## 2014-09-27 DIAGNOSIS — L97921 Non-pressure chronic ulcer of unspecified part of left lower leg limited to breakdown of skin: Secondary | ICD-10-CM | POA: Diagnosis not present

## 2014-09-30 DIAGNOSIS — L97911 Non-pressure chronic ulcer of unspecified part of right lower leg limited to breakdown of skin: Secondary | ICD-10-CM | POA: Diagnosis not present

## 2014-09-30 DIAGNOSIS — L97921 Non-pressure chronic ulcer of unspecified part of left lower leg limited to breakdown of skin: Secondary | ICD-10-CM | POA: Diagnosis not present

## 2014-09-30 DIAGNOSIS — M545 Low back pain: Secondary | ICD-10-CM | POA: Diagnosis not present

## 2014-09-30 DIAGNOSIS — L409 Psoriasis, unspecified: Secondary | ICD-10-CM | POA: Diagnosis not present

## 2014-09-30 DIAGNOSIS — M47896 Other spondylosis, lumbar region: Secondary | ICD-10-CM | POA: Diagnosis not present

## 2014-09-30 DIAGNOSIS — M47816 Spondylosis without myelopathy or radiculopathy, lumbar region: Secondary | ICD-10-CM | POA: Diagnosis not present

## 2014-09-30 DIAGNOSIS — E669 Obesity, unspecified: Secondary | ICD-10-CM | POA: Diagnosis not present

## 2014-10-02 DIAGNOSIS — L97911 Non-pressure chronic ulcer of unspecified part of right lower leg limited to breakdown of skin: Secondary | ICD-10-CM | POA: Diagnosis not present

## 2014-10-02 DIAGNOSIS — L97921 Non-pressure chronic ulcer of unspecified part of left lower leg limited to breakdown of skin: Secondary | ICD-10-CM | POA: Diagnosis not present

## 2014-10-02 DIAGNOSIS — L409 Psoriasis, unspecified: Secondary | ICD-10-CM | POA: Diagnosis not present

## 2014-10-02 DIAGNOSIS — E669 Obesity, unspecified: Secondary | ICD-10-CM | POA: Diagnosis not present

## 2014-10-03 DIAGNOSIS — L409 Psoriasis, unspecified: Secondary | ICD-10-CM | POA: Diagnosis not present

## 2014-10-03 DIAGNOSIS — E669 Obesity, unspecified: Secondary | ICD-10-CM | POA: Diagnosis not present

## 2014-10-03 DIAGNOSIS — L97921 Non-pressure chronic ulcer of unspecified part of left lower leg limited to breakdown of skin: Secondary | ICD-10-CM | POA: Diagnosis not present

## 2014-10-03 DIAGNOSIS — L97911 Non-pressure chronic ulcer of unspecified part of right lower leg limited to breakdown of skin: Secondary | ICD-10-CM | POA: Diagnosis not present

## 2014-10-07 DIAGNOSIS — L97911 Non-pressure chronic ulcer of unspecified part of right lower leg limited to breakdown of skin: Secondary | ICD-10-CM | POA: Diagnosis not present

## 2014-10-07 DIAGNOSIS — L97921 Non-pressure chronic ulcer of unspecified part of left lower leg limited to breakdown of skin: Secondary | ICD-10-CM | POA: Diagnosis not present

## 2014-10-07 DIAGNOSIS — E669 Obesity, unspecified: Secondary | ICD-10-CM | POA: Diagnosis not present

## 2014-10-07 DIAGNOSIS — L409 Psoriasis, unspecified: Secondary | ICD-10-CM | POA: Diagnosis not present

## 2014-10-09 DIAGNOSIS — L409 Psoriasis, unspecified: Secondary | ICD-10-CM | POA: Diagnosis not present

## 2014-10-09 DIAGNOSIS — L97921 Non-pressure chronic ulcer of unspecified part of left lower leg limited to breakdown of skin: Secondary | ICD-10-CM | POA: Diagnosis not present

## 2014-10-09 DIAGNOSIS — E669 Obesity, unspecified: Secondary | ICD-10-CM | POA: Diagnosis not present

## 2014-10-09 DIAGNOSIS — L97911 Non-pressure chronic ulcer of unspecified part of right lower leg limited to breakdown of skin: Secondary | ICD-10-CM | POA: Diagnosis not present

## 2014-10-10 DIAGNOSIS — L97911 Non-pressure chronic ulcer of unspecified part of right lower leg limited to breakdown of skin: Secondary | ICD-10-CM | POA: Diagnosis not present

## 2014-10-10 DIAGNOSIS — L97921 Non-pressure chronic ulcer of unspecified part of left lower leg limited to breakdown of skin: Secondary | ICD-10-CM | POA: Diagnosis not present

## 2014-10-10 DIAGNOSIS — E669 Obesity, unspecified: Secondary | ICD-10-CM | POA: Diagnosis not present

## 2014-10-10 DIAGNOSIS — L409 Psoriasis, unspecified: Secondary | ICD-10-CM | POA: Diagnosis not present

## 2014-10-14 DIAGNOSIS — L97921 Non-pressure chronic ulcer of unspecified part of left lower leg limited to breakdown of skin: Secondary | ICD-10-CM | POA: Diagnosis not present

## 2014-10-14 DIAGNOSIS — E669 Obesity, unspecified: Secondary | ICD-10-CM | POA: Diagnosis not present

## 2014-10-14 DIAGNOSIS — L97911 Non-pressure chronic ulcer of unspecified part of right lower leg limited to breakdown of skin: Secondary | ICD-10-CM | POA: Diagnosis not present

## 2014-10-14 DIAGNOSIS — L409 Psoriasis, unspecified: Secondary | ICD-10-CM | POA: Diagnosis not present

## 2014-10-16 DIAGNOSIS — L97911 Non-pressure chronic ulcer of unspecified part of right lower leg limited to breakdown of skin: Secondary | ICD-10-CM | POA: Diagnosis not present

## 2014-10-16 DIAGNOSIS — L409 Psoriasis, unspecified: Secondary | ICD-10-CM | POA: Diagnosis not present

## 2014-10-16 DIAGNOSIS — E669 Obesity, unspecified: Secondary | ICD-10-CM | POA: Diagnosis not present

## 2014-10-16 DIAGNOSIS — L97921 Non-pressure chronic ulcer of unspecified part of left lower leg limited to breakdown of skin: Secondary | ICD-10-CM | POA: Diagnosis not present

## 2014-10-17 DIAGNOSIS — E669 Obesity, unspecified: Secondary | ICD-10-CM | POA: Diagnosis not present

## 2014-10-17 DIAGNOSIS — L97921 Non-pressure chronic ulcer of unspecified part of left lower leg limited to breakdown of skin: Secondary | ICD-10-CM | POA: Diagnosis not present

## 2014-10-17 DIAGNOSIS — L409 Psoriasis, unspecified: Secondary | ICD-10-CM | POA: Diagnosis not present

## 2014-10-17 DIAGNOSIS — L97911 Non-pressure chronic ulcer of unspecified part of right lower leg limited to breakdown of skin: Secondary | ICD-10-CM | POA: Diagnosis not present

## 2014-10-21 DIAGNOSIS — E669 Obesity, unspecified: Secondary | ICD-10-CM | POA: Diagnosis not present

## 2014-10-21 DIAGNOSIS — L97921 Non-pressure chronic ulcer of unspecified part of left lower leg limited to breakdown of skin: Secondary | ICD-10-CM | POA: Diagnosis not present

## 2014-10-21 DIAGNOSIS — L409 Psoriasis, unspecified: Secondary | ICD-10-CM | POA: Diagnosis not present

## 2014-10-21 DIAGNOSIS — L97911 Non-pressure chronic ulcer of unspecified part of right lower leg limited to breakdown of skin: Secondary | ICD-10-CM | POA: Diagnosis not present

## 2014-10-22 DIAGNOSIS — I4891 Unspecified atrial fibrillation: Secondary | ICD-10-CM | POA: Diagnosis not present

## 2014-10-22 DIAGNOSIS — L409 Psoriasis, unspecified: Secondary | ICD-10-CM | POA: Diagnosis not present

## 2014-10-22 DIAGNOSIS — Z1389 Encounter for screening for other disorder: Secondary | ICD-10-CM | POA: Diagnosis not present

## 2014-10-22 DIAGNOSIS — E291 Testicular hypofunction: Secondary | ICD-10-CM | POA: Diagnosis not present

## 2014-10-22 DIAGNOSIS — M549 Dorsalgia, unspecified: Secondary | ICD-10-CM | POA: Diagnosis not present

## 2014-10-22 DIAGNOSIS — Z79899 Other long term (current) drug therapy: Secondary | ICD-10-CM | POA: Diagnosis not present

## 2014-10-22 DIAGNOSIS — I10 Essential (primary) hypertension: Secondary | ICD-10-CM | POA: Diagnosis not present

## 2014-10-22 DIAGNOSIS — I83009 Varicose veins of unspecified lower extremity with ulcer of unspecified site: Secondary | ICD-10-CM | POA: Diagnosis not present

## 2014-10-24 DIAGNOSIS — L409 Psoriasis, unspecified: Secondary | ICD-10-CM | POA: Diagnosis not present

## 2014-10-24 DIAGNOSIS — L97911 Non-pressure chronic ulcer of unspecified part of right lower leg limited to breakdown of skin: Secondary | ICD-10-CM | POA: Diagnosis not present

## 2014-10-24 DIAGNOSIS — E669 Obesity, unspecified: Secondary | ICD-10-CM | POA: Diagnosis not present

## 2014-10-24 DIAGNOSIS — L97921 Non-pressure chronic ulcer of unspecified part of left lower leg limited to breakdown of skin: Secondary | ICD-10-CM | POA: Diagnosis not present

## 2014-10-29 DIAGNOSIS — L97921 Non-pressure chronic ulcer of unspecified part of left lower leg limited to breakdown of skin: Secondary | ICD-10-CM | POA: Diagnosis not present

## 2014-10-29 DIAGNOSIS — L97911 Non-pressure chronic ulcer of unspecified part of right lower leg limited to breakdown of skin: Secondary | ICD-10-CM | POA: Diagnosis not present

## 2014-10-29 DIAGNOSIS — L409 Psoriasis, unspecified: Secondary | ICD-10-CM | POA: Diagnosis not present

## 2014-10-29 DIAGNOSIS — E669 Obesity, unspecified: Secondary | ICD-10-CM | POA: Diagnosis not present

## 2014-10-31 DIAGNOSIS — L97921 Non-pressure chronic ulcer of unspecified part of left lower leg limited to breakdown of skin: Secondary | ICD-10-CM | POA: Diagnosis not present

## 2014-10-31 DIAGNOSIS — L409 Psoriasis, unspecified: Secondary | ICD-10-CM | POA: Diagnosis not present

## 2014-10-31 DIAGNOSIS — L97911 Non-pressure chronic ulcer of unspecified part of right lower leg limited to breakdown of skin: Secondary | ICD-10-CM | POA: Diagnosis not present

## 2014-10-31 DIAGNOSIS — E669 Obesity, unspecified: Secondary | ICD-10-CM | POA: Diagnosis not present

## 2014-11-04 DIAGNOSIS — L97921 Non-pressure chronic ulcer of unspecified part of left lower leg limited to breakdown of skin: Secondary | ICD-10-CM | POA: Diagnosis not present

## 2014-11-04 DIAGNOSIS — E669 Obesity, unspecified: Secondary | ICD-10-CM | POA: Diagnosis not present

## 2014-11-04 DIAGNOSIS — L97911 Non-pressure chronic ulcer of unspecified part of right lower leg limited to breakdown of skin: Secondary | ICD-10-CM | POA: Diagnosis not present

## 2014-11-04 DIAGNOSIS — L409 Psoriasis, unspecified: Secondary | ICD-10-CM | POA: Diagnosis not present

## 2014-11-07 DIAGNOSIS — L97911 Non-pressure chronic ulcer of unspecified part of right lower leg limited to breakdown of skin: Secondary | ICD-10-CM | POA: Diagnosis not present

## 2014-11-07 DIAGNOSIS — L97921 Non-pressure chronic ulcer of unspecified part of left lower leg limited to breakdown of skin: Secondary | ICD-10-CM | POA: Diagnosis not present

## 2014-11-07 DIAGNOSIS — L409 Psoriasis, unspecified: Secondary | ICD-10-CM | POA: Diagnosis not present

## 2014-11-07 DIAGNOSIS — E669 Obesity, unspecified: Secondary | ICD-10-CM | POA: Diagnosis not present

## 2014-11-11 DIAGNOSIS — E291 Testicular hypofunction: Secondary | ICD-10-CM | POA: Diagnosis not present

## 2014-11-11 DIAGNOSIS — I10 Essential (primary) hypertension: Secondary | ICD-10-CM | POA: Diagnosis not present

## 2014-11-11 DIAGNOSIS — M549 Dorsalgia, unspecified: Secondary | ICD-10-CM | POA: Diagnosis not present

## 2014-11-11 DIAGNOSIS — L409 Psoriasis, unspecified: Secondary | ICD-10-CM | POA: Diagnosis not present

## 2014-11-11 DIAGNOSIS — I83009 Varicose veins of unspecified lower extremity with ulcer of unspecified site: Secondary | ICD-10-CM | POA: Diagnosis not present

## 2014-11-11 DIAGNOSIS — L97911 Non-pressure chronic ulcer of unspecified part of right lower leg limited to breakdown of skin: Secondary | ICD-10-CM | POA: Diagnosis not present

## 2014-11-11 DIAGNOSIS — E669 Obesity, unspecified: Secondary | ICD-10-CM | POA: Diagnosis not present

## 2014-11-11 DIAGNOSIS — I4891 Unspecified atrial fibrillation: Secondary | ICD-10-CM | POA: Diagnosis not present

## 2014-11-11 DIAGNOSIS — Z6841 Body Mass Index (BMI) 40.0 and over, adult: Secondary | ICD-10-CM | POA: Diagnosis not present

## 2014-11-11 DIAGNOSIS — L97921 Non-pressure chronic ulcer of unspecified part of left lower leg limited to breakdown of skin: Secondary | ICD-10-CM | POA: Diagnosis not present

## 2014-11-12 DIAGNOSIS — L97911 Non-pressure chronic ulcer of unspecified part of right lower leg limited to breakdown of skin: Secondary | ICD-10-CM | POA: Diagnosis not present

## 2014-11-12 DIAGNOSIS — E669 Obesity, unspecified: Secondary | ICD-10-CM | POA: Diagnosis not present

## 2014-11-12 DIAGNOSIS — L97921 Non-pressure chronic ulcer of unspecified part of left lower leg limited to breakdown of skin: Secondary | ICD-10-CM | POA: Diagnosis not present

## 2014-11-12 DIAGNOSIS — L409 Psoriasis, unspecified: Secondary | ICD-10-CM | POA: Diagnosis not present

## 2014-11-14 DIAGNOSIS — E669 Obesity, unspecified: Secondary | ICD-10-CM | POA: Diagnosis not present

## 2014-11-14 DIAGNOSIS — L97921 Non-pressure chronic ulcer of unspecified part of left lower leg limited to breakdown of skin: Secondary | ICD-10-CM | POA: Diagnosis not present

## 2014-11-14 DIAGNOSIS — L97911 Non-pressure chronic ulcer of unspecified part of right lower leg limited to breakdown of skin: Secondary | ICD-10-CM | POA: Diagnosis not present

## 2014-11-14 DIAGNOSIS — L409 Psoriasis, unspecified: Secondary | ICD-10-CM | POA: Diagnosis not present

## 2014-11-18 DIAGNOSIS — I4891 Unspecified atrial fibrillation: Secondary | ICD-10-CM | POA: Diagnosis not present

## 2014-11-18 DIAGNOSIS — R079 Chest pain, unspecified: Secondary | ICD-10-CM | POA: Diagnosis not present

## 2014-11-18 DIAGNOSIS — R071 Chest pain on breathing: Secondary | ICD-10-CM | POA: Diagnosis not present

## 2014-11-18 DIAGNOSIS — L409 Psoriasis, unspecified: Secondary | ICD-10-CM | POA: Diagnosis not present

## 2014-11-18 DIAGNOSIS — I480 Paroxysmal atrial fibrillation: Secondary | ICD-10-CM | POA: Diagnosis not present

## 2014-11-18 DIAGNOSIS — E669 Obesity, unspecified: Secondary | ICD-10-CM | POA: Diagnosis not present

## 2014-11-18 DIAGNOSIS — R0789 Other chest pain: Secondary | ICD-10-CM | POA: Diagnosis not present

## 2014-11-18 DIAGNOSIS — L97921 Non-pressure chronic ulcer of unspecified part of left lower leg limited to breakdown of skin: Secondary | ICD-10-CM | POA: Diagnosis not present

## 2014-11-18 DIAGNOSIS — L97911 Non-pressure chronic ulcer of unspecified part of right lower leg limited to breakdown of skin: Secondary | ICD-10-CM | POA: Diagnosis not present

## 2014-11-21 DIAGNOSIS — L97911 Non-pressure chronic ulcer of unspecified part of right lower leg limited to breakdown of skin: Secondary | ICD-10-CM | POA: Diagnosis not present

## 2014-11-21 DIAGNOSIS — L409 Psoriasis, unspecified: Secondary | ICD-10-CM | POA: Diagnosis not present

## 2014-11-21 DIAGNOSIS — L97921 Non-pressure chronic ulcer of unspecified part of left lower leg limited to breakdown of skin: Secondary | ICD-10-CM | POA: Diagnosis not present

## 2014-11-21 DIAGNOSIS — E669 Obesity, unspecified: Secondary | ICD-10-CM | POA: Diagnosis not present

## 2015-03-04 DIAGNOSIS — Z1389 Encounter for screening for other disorder: Secondary | ICD-10-CM | POA: Diagnosis not present

## 2015-03-04 DIAGNOSIS — L409 Psoriasis, unspecified: Secondary | ICD-10-CM | POA: Diagnosis not present

## 2015-03-04 DIAGNOSIS — I10 Essential (primary) hypertension: Secondary | ICD-10-CM | POA: Diagnosis not present

## 2015-03-04 DIAGNOSIS — Z125 Encounter for screening for malignant neoplasm of prostate: Secondary | ICD-10-CM | POA: Diagnosis not present

## 2015-03-04 DIAGNOSIS — M549 Dorsalgia, unspecified: Secondary | ICD-10-CM | POA: Diagnosis not present

## 2015-03-04 DIAGNOSIS — E291 Testicular hypofunction: Secondary | ICD-10-CM | POA: Diagnosis not present

## 2015-03-04 DIAGNOSIS — I83009 Varicose veins of unspecified lower extremity with ulcer of unspecified site: Secondary | ICD-10-CM | POA: Diagnosis not present

## 2015-03-04 DIAGNOSIS — Z6841 Body Mass Index (BMI) 40.0 and over, adult: Secondary | ICD-10-CM | POA: Diagnosis not present

## 2015-03-04 DIAGNOSIS — R079 Chest pain, unspecified: Secondary | ICD-10-CM | POA: Diagnosis not present

## 2015-03-04 DIAGNOSIS — I4891 Unspecified atrial fibrillation: Secondary | ICD-10-CM | POA: Diagnosis not present

## 2015-03-04 DIAGNOSIS — Z79899 Other long term (current) drug therapy: Secondary | ICD-10-CM | POA: Diagnosis not present

## 2015-03-07 DIAGNOSIS — I10 Essential (primary) hypertension: Secondary | ICD-10-CM | POA: Diagnosis not present

## 2015-03-07 DIAGNOSIS — G894 Chronic pain syndrome: Secondary | ICD-10-CM | POA: Diagnosis not present

## 2015-03-07 DIAGNOSIS — I83009 Varicose veins of unspecified lower extremity with ulcer of unspecified site: Secondary | ICD-10-CM | POA: Diagnosis not present

## 2015-03-07 DIAGNOSIS — E291 Testicular hypofunction: Secondary | ICD-10-CM | POA: Diagnosis not present

## 2015-03-07 DIAGNOSIS — R609 Edema, unspecified: Secondary | ICD-10-CM | POA: Diagnosis not present

## 2015-03-07 DIAGNOSIS — I4891 Unspecified atrial fibrillation: Secondary | ICD-10-CM | POA: Diagnosis not present

## 2015-03-10 DIAGNOSIS — I10 Essential (primary) hypertension: Secondary | ICD-10-CM | POA: Diagnosis not present

## 2015-03-10 DIAGNOSIS — I4891 Unspecified atrial fibrillation: Secondary | ICD-10-CM | POA: Diagnosis not present

## 2015-03-10 DIAGNOSIS — E291 Testicular hypofunction: Secondary | ICD-10-CM | POA: Diagnosis not present

## 2015-03-10 DIAGNOSIS — I83009 Varicose veins of unspecified lower extremity with ulcer of unspecified site: Secondary | ICD-10-CM | POA: Diagnosis not present

## 2015-03-10 DIAGNOSIS — G894 Chronic pain syndrome: Secondary | ICD-10-CM | POA: Diagnosis not present

## 2015-03-10 DIAGNOSIS — R609 Edema, unspecified: Secondary | ICD-10-CM | POA: Diagnosis not present

## 2015-03-11 DIAGNOSIS — E291 Testicular hypofunction: Secondary | ICD-10-CM | POA: Diagnosis not present

## 2015-03-11 DIAGNOSIS — I83009 Varicose veins of unspecified lower extremity with ulcer of unspecified site: Secondary | ICD-10-CM | POA: Diagnosis not present

## 2015-03-11 DIAGNOSIS — I4891 Unspecified atrial fibrillation: Secondary | ICD-10-CM | POA: Diagnosis not present

## 2015-03-11 DIAGNOSIS — G894 Chronic pain syndrome: Secondary | ICD-10-CM | POA: Diagnosis not present

## 2015-03-11 DIAGNOSIS — R609 Edema, unspecified: Secondary | ICD-10-CM | POA: Diagnosis not present

## 2015-03-11 DIAGNOSIS — I10 Essential (primary) hypertension: Secondary | ICD-10-CM | POA: Diagnosis not present

## 2015-03-13 DIAGNOSIS — E291 Testicular hypofunction: Secondary | ICD-10-CM | POA: Diagnosis not present

## 2015-03-13 DIAGNOSIS — I4891 Unspecified atrial fibrillation: Secondary | ICD-10-CM | POA: Diagnosis not present

## 2015-03-13 DIAGNOSIS — R609 Edema, unspecified: Secondary | ICD-10-CM | POA: Diagnosis not present

## 2015-03-13 DIAGNOSIS — G894 Chronic pain syndrome: Secondary | ICD-10-CM | POA: Diagnosis not present

## 2015-03-13 DIAGNOSIS — I83009 Varicose veins of unspecified lower extremity with ulcer of unspecified site: Secondary | ICD-10-CM | POA: Diagnosis not present

## 2015-03-13 DIAGNOSIS — I10 Essential (primary) hypertension: Secondary | ICD-10-CM | POA: Diagnosis not present

## 2015-03-14 DIAGNOSIS — G894 Chronic pain syndrome: Secondary | ICD-10-CM | POA: Diagnosis not present

## 2015-03-14 DIAGNOSIS — I4891 Unspecified atrial fibrillation: Secondary | ICD-10-CM | POA: Diagnosis not present

## 2015-03-14 DIAGNOSIS — I10 Essential (primary) hypertension: Secondary | ICD-10-CM | POA: Diagnosis not present

## 2015-03-14 DIAGNOSIS — E291 Testicular hypofunction: Secondary | ICD-10-CM | POA: Diagnosis not present

## 2015-03-14 DIAGNOSIS — R609 Edema, unspecified: Secondary | ICD-10-CM | POA: Diagnosis not present

## 2015-03-14 DIAGNOSIS — I83009 Varicose veins of unspecified lower extremity with ulcer of unspecified site: Secondary | ICD-10-CM | POA: Diagnosis not present

## 2015-03-17 DIAGNOSIS — G894 Chronic pain syndrome: Secondary | ICD-10-CM | POA: Diagnosis not present

## 2015-03-17 DIAGNOSIS — R609 Edema, unspecified: Secondary | ICD-10-CM | POA: Diagnosis not present

## 2015-03-17 DIAGNOSIS — I10 Essential (primary) hypertension: Secondary | ICD-10-CM | POA: Diagnosis not present

## 2015-03-17 DIAGNOSIS — I83009 Varicose veins of unspecified lower extremity with ulcer of unspecified site: Secondary | ICD-10-CM | POA: Diagnosis not present

## 2015-03-17 DIAGNOSIS — I4891 Unspecified atrial fibrillation: Secondary | ICD-10-CM | POA: Diagnosis not present

## 2015-03-17 DIAGNOSIS — E291 Testicular hypofunction: Secondary | ICD-10-CM | POA: Diagnosis not present

## 2015-03-18 DIAGNOSIS — R609 Edema, unspecified: Secondary | ICD-10-CM | POA: Diagnosis not present

## 2015-03-18 DIAGNOSIS — I4891 Unspecified atrial fibrillation: Secondary | ICD-10-CM | POA: Diagnosis not present

## 2015-03-18 DIAGNOSIS — E291 Testicular hypofunction: Secondary | ICD-10-CM | POA: Diagnosis not present

## 2015-03-18 DIAGNOSIS — G894 Chronic pain syndrome: Secondary | ICD-10-CM | POA: Diagnosis not present

## 2015-03-18 DIAGNOSIS — I10 Essential (primary) hypertension: Secondary | ICD-10-CM | POA: Diagnosis not present

## 2015-03-18 DIAGNOSIS — I83009 Varicose veins of unspecified lower extremity with ulcer of unspecified site: Secondary | ICD-10-CM | POA: Diagnosis not present

## 2015-03-21 DIAGNOSIS — E291 Testicular hypofunction: Secondary | ICD-10-CM | POA: Diagnosis not present

## 2015-03-21 DIAGNOSIS — G894 Chronic pain syndrome: Secondary | ICD-10-CM | POA: Diagnosis not present

## 2015-03-21 DIAGNOSIS — I10 Essential (primary) hypertension: Secondary | ICD-10-CM | POA: Diagnosis not present

## 2015-03-21 DIAGNOSIS — I4891 Unspecified atrial fibrillation: Secondary | ICD-10-CM | POA: Diagnosis not present

## 2015-03-21 DIAGNOSIS — I83009 Varicose veins of unspecified lower extremity with ulcer of unspecified site: Secondary | ICD-10-CM | POA: Diagnosis not present

## 2015-03-21 DIAGNOSIS — R609 Edema, unspecified: Secondary | ICD-10-CM | POA: Diagnosis not present

## 2015-03-25 DIAGNOSIS — R609 Edema, unspecified: Secondary | ICD-10-CM | POA: Diagnosis not present

## 2015-03-25 DIAGNOSIS — R079 Chest pain, unspecified: Secondary | ICD-10-CM | POA: Diagnosis not present

## 2015-03-25 DIAGNOSIS — E291 Testicular hypofunction: Secondary | ICD-10-CM | POA: Diagnosis not present

## 2015-03-25 DIAGNOSIS — R0609 Other forms of dyspnea: Secondary | ICD-10-CM | POA: Diagnosis not present

## 2015-03-25 DIAGNOSIS — I482 Chronic atrial fibrillation: Secondary | ICD-10-CM | POA: Diagnosis not present

## 2015-03-25 DIAGNOSIS — G894 Chronic pain syndrome: Secondary | ICD-10-CM | POA: Diagnosis not present

## 2015-03-25 DIAGNOSIS — I4891 Unspecified atrial fibrillation: Secondary | ICD-10-CM | POA: Diagnosis not present

## 2015-03-25 DIAGNOSIS — I83009 Varicose veins of unspecified lower extremity with ulcer of unspecified site: Secondary | ICD-10-CM | POA: Diagnosis not present

## 2015-03-25 DIAGNOSIS — I10 Essential (primary) hypertension: Secondary | ICD-10-CM | POA: Diagnosis not present

## 2015-03-27 DIAGNOSIS — I4891 Unspecified atrial fibrillation: Secondary | ICD-10-CM | POA: Diagnosis not present

## 2015-03-27 DIAGNOSIS — I10 Essential (primary) hypertension: Secondary | ICD-10-CM | POA: Diagnosis not present

## 2015-03-27 DIAGNOSIS — E291 Testicular hypofunction: Secondary | ICD-10-CM | POA: Diagnosis not present

## 2015-03-27 DIAGNOSIS — G894 Chronic pain syndrome: Secondary | ICD-10-CM | POA: Diagnosis not present

## 2015-03-27 DIAGNOSIS — I83009 Varicose veins of unspecified lower extremity with ulcer of unspecified site: Secondary | ICD-10-CM | POA: Diagnosis not present

## 2015-03-27 DIAGNOSIS — R609 Edema, unspecified: Secondary | ICD-10-CM | POA: Diagnosis not present

## 2015-03-28 DIAGNOSIS — I83009 Varicose veins of unspecified lower extremity with ulcer of unspecified site: Secondary | ICD-10-CM | POA: Diagnosis not present

## 2015-03-28 DIAGNOSIS — I4891 Unspecified atrial fibrillation: Secondary | ICD-10-CM | POA: Diagnosis not present

## 2015-03-28 DIAGNOSIS — G894 Chronic pain syndrome: Secondary | ICD-10-CM | POA: Diagnosis not present

## 2015-03-28 DIAGNOSIS — I10 Essential (primary) hypertension: Secondary | ICD-10-CM | POA: Diagnosis not present

## 2015-03-28 DIAGNOSIS — E291 Testicular hypofunction: Secondary | ICD-10-CM | POA: Diagnosis not present

## 2015-03-28 DIAGNOSIS — R609 Edema, unspecified: Secondary | ICD-10-CM | POA: Diagnosis not present

## 2015-03-31 DIAGNOSIS — G894 Chronic pain syndrome: Secondary | ICD-10-CM | POA: Diagnosis not present

## 2015-03-31 DIAGNOSIS — I83009 Varicose veins of unspecified lower extremity with ulcer of unspecified site: Secondary | ICD-10-CM | POA: Diagnosis not present

## 2015-03-31 DIAGNOSIS — E291 Testicular hypofunction: Secondary | ICD-10-CM | POA: Diagnosis not present

## 2015-03-31 DIAGNOSIS — I10 Essential (primary) hypertension: Secondary | ICD-10-CM | POA: Diagnosis not present

## 2015-03-31 DIAGNOSIS — I4891 Unspecified atrial fibrillation: Secondary | ICD-10-CM | POA: Diagnosis not present

## 2015-03-31 DIAGNOSIS — R609 Edema, unspecified: Secondary | ICD-10-CM | POA: Diagnosis not present

## 2015-04-01 DIAGNOSIS — I4891 Unspecified atrial fibrillation: Secondary | ICD-10-CM | POA: Diagnosis not present

## 2015-04-01 DIAGNOSIS — I83009 Varicose veins of unspecified lower extremity with ulcer of unspecified site: Secondary | ICD-10-CM | POA: Diagnosis not present

## 2015-04-01 DIAGNOSIS — G894 Chronic pain syndrome: Secondary | ICD-10-CM | POA: Diagnosis not present

## 2015-04-01 DIAGNOSIS — E291 Testicular hypofunction: Secondary | ICD-10-CM | POA: Diagnosis not present

## 2015-04-01 DIAGNOSIS — I10 Essential (primary) hypertension: Secondary | ICD-10-CM | POA: Diagnosis not present

## 2015-04-01 DIAGNOSIS — R609 Edema, unspecified: Secondary | ICD-10-CM | POA: Diagnosis not present

## 2015-04-04 DIAGNOSIS — G894 Chronic pain syndrome: Secondary | ICD-10-CM | POA: Diagnosis not present

## 2015-04-04 DIAGNOSIS — E291 Testicular hypofunction: Secondary | ICD-10-CM | POA: Diagnosis not present

## 2015-04-04 DIAGNOSIS — I4891 Unspecified atrial fibrillation: Secondary | ICD-10-CM | POA: Diagnosis not present

## 2015-04-04 DIAGNOSIS — I10 Essential (primary) hypertension: Secondary | ICD-10-CM | POA: Diagnosis not present

## 2015-04-04 DIAGNOSIS — R609 Edema, unspecified: Secondary | ICD-10-CM | POA: Diagnosis not present

## 2015-04-04 DIAGNOSIS — I83009 Varicose veins of unspecified lower extremity with ulcer of unspecified site: Secondary | ICD-10-CM | POA: Diagnosis not present

## 2015-04-07 DIAGNOSIS — Z79899 Other long term (current) drug therapy: Secondary | ICD-10-CM | POA: Diagnosis not present

## 2015-04-07 DIAGNOSIS — E291 Testicular hypofunction: Secondary | ICD-10-CM | POA: Diagnosis not present

## 2015-04-07 DIAGNOSIS — I10 Essential (primary) hypertension: Secondary | ICD-10-CM | POA: Diagnosis not present

## 2015-04-07 DIAGNOSIS — R609 Edema, unspecified: Secondary | ICD-10-CM | POA: Diagnosis not present

## 2015-04-07 DIAGNOSIS — I83009 Varicose veins of unspecified lower extremity with ulcer of unspecified site: Secondary | ICD-10-CM | POA: Diagnosis not present

## 2015-04-07 DIAGNOSIS — I4891 Unspecified atrial fibrillation: Secondary | ICD-10-CM | POA: Diagnosis not present

## 2015-04-07 DIAGNOSIS — G894 Chronic pain syndrome: Secondary | ICD-10-CM | POA: Diagnosis not present

## 2015-04-08 DIAGNOSIS — I83009 Varicose veins of unspecified lower extremity with ulcer of unspecified site: Secondary | ICD-10-CM | POA: Diagnosis not present

## 2015-04-08 DIAGNOSIS — I10 Essential (primary) hypertension: Secondary | ICD-10-CM | POA: Diagnosis not present

## 2015-04-08 DIAGNOSIS — R609 Edema, unspecified: Secondary | ICD-10-CM | POA: Diagnosis not present

## 2015-04-08 DIAGNOSIS — E291 Testicular hypofunction: Secondary | ICD-10-CM | POA: Diagnosis not present

## 2015-04-08 DIAGNOSIS — I4891 Unspecified atrial fibrillation: Secondary | ICD-10-CM | POA: Diagnosis not present

## 2015-04-08 DIAGNOSIS — G894 Chronic pain syndrome: Secondary | ICD-10-CM | POA: Diagnosis not present

## 2015-04-10 DIAGNOSIS — I10 Essential (primary) hypertension: Secondary | ICD-10-CM | POA: Diagnosis not present

## 2015-04-10 DIAGNOSIS — G894 Chronic pain syndrome: Secondary | ICD-10-CM | POA: Diagnosis not present

## 2015-04-10 DIAGNOSIS — I4891 Unspecified atrial fibrillation: Secondary | ICD-10-CM | POA: Diagnosis not present

## 2015-04-10 DIAGNOSIS — E291 Testicular hypofunction: Secondary | ICD-10-CM | POA: Diagnosis not present

## 2015-04-10 DIAGNOSIS — I83009 Varicose veins of unspecified lower extremity with ulcer of unspecified site: Secondary | ICD-10-CM | POA: Diagnosis not present

## 2015-04-10 DIAGNOSIS — R609 Edema, unspecified: Secondary | ICD-10-CM | POA: Diagnosis not present

## 2015-04-11 DIAGNOSIS — I10 Essential (primary) hypertension: Secondary | ICD-10-CM | POA: Diagnosis not present

## 2015-04-11 DIAGNOSIS — I83009 Varicose veins of unspecified lower extremity with ulcer of unspecified site: Secondary | ICD-10-CM | POA: Diagnosis not present

## 2015-04-11 DIAGNOSIS — E291 Testicular hypofunction: Secondary | ICD-10-CM | POA: Diagnosis not present

## 2015-04-11 DIAGNOSIS — I4891 Unspecified atrial fibrillation: Secondary | ICD-10-CM | POA: Diagnosis not present

## 2015-04-11 DIAGNOSIS — G894 Chronic pain syndrome: Secondary | ICD-10-CM | POA: Diagnosis not present

## 2015-04-11 DIAGNOSIS — R609 Edema, unspecified: Secondary | ICD-10-CM | POA: Diagnosis not present

## 2015-04-15 DIAGNOSIS — R609 Edema, unspecified: Secondary | ICD-10-CM | POA: Diagnosis not present

## 2015-04-15 DIAGNOSIS — I83009 Varicose veins of unspecified lower extremity with ulcer of unspecified site: Secondary | ICD-10-CM | POA: Diagnosis not present

## 2015-04-15 DIAGNOSIS — E291 Testicular hypofunction: Secondary | ICD-10-CM | POA: Diagnosis not present

## 2015-04-15 DIAGNOSIS — G894 Chronic pain syndrome: Secondary | ICD-10-CM | POA: Diagnosis not present

## 2015-04-15 DIAGNOSIS — I4891 Unspecified atrial fibrillation: Secondary | ICD-10-CM | POA: Diagnosis not present

## 2015-04-15 DIAGNOSIS — I10 Essential (primary) hypertension: Secondary | ICD-10-CM | POA: Diagnosis not present

## 2015-04-18 DIAGNOSIS — I83009 Varicose veins of unspecified lower extremity with ulcer of unspecified site: Secondary | ICD-10-CM | POA: Diagnosis not present

## 2015-04-18 DIAGNOSIS — I4891 Unspecified atrial fibrillation: Secondary | ICD-10-CM | POA: Diagnosis not present

## 2015-04-18 DIAGNOSIS — R609 Edema, unspecified: Secondary | ICD-10-CM | POA: Diagnosis not present

## 2015-04-18 DIAGNOSIS — I10 Essential (primary) hypertension: Secondary | ICD-10-CM | POA: Diagnosis not present

## 2015-04-18 DIAGNOSIS — G894 Chronic pain syndrome: Secondary | ICD-10-CM | POA: Diagnosis not present

## 2015-04-18 DIAGNOSIS — E291 Testicular hypofunction: Secondary | ICD-10-CM | POA: Diagnosis not present

## 2015-04-22 DIAGNOSIS — E291 Testicular hypofunction: Secondary | ICD-10-CM | POA: Diagnosis not present

## 2015-04-22 DIAGNOSIS — I10 Essential (primary) hypertension: Secondary | ICD-10-CM | POA: Diagnosis not present

## 2015-04-22 DIAGNOSIS — I83009 Varicose veins of unspecified lower extremity with ulcer of unspecified site: Secondary | ICD-10-CM | POA: Diagnosis not present

## 2015-04-22 DIAGNOSIS — R609 Edema, unspecified: Secondary | ICD-10-CM | POA: Diagnosis not present

## 2015-04-22 DIAGNOSIS — I4891 Unspecified atrial fibrillation: Secondary | ICD-10-CM | POA: Diagnosis not present

## 2015-04-22 DIAGNOSIS — G894 Chronic pain syndrome: Secondary | ICD-10-CM | POA: Diagnosis not present

## 2015-04-23 DIAGNOSIS — L03119 Cellulitis of unspecified part of limb: Secondary | ICD-10-CM | POA: Diagnosis not present

## 2015-04-23 DIAGNOSIS — I83009 Varicose veins of unspecified lower extremity with ulcer of unspecified site: Secondary | ICD-10-CM | POA: Diagnosis not present

## 2015-05-06 DIAGNOSIS — L97821 Non-pressure chronic ulcer of other part of left lower leg limited to breakdown of skin: Secondary | ICD-10-CM | POA: Diagnosis not present

## 2015-05-06 DIAGNOSIS — R609 Edema, unspecified: Secondary | ICD-10-CM | POA: Diagnosis not present

## 2015-05-06 DIAGNOSIS — L97811 Non-pressure chronic ulcer of other part of right lower leg limited to breakdown of skin: Secondary | ICD-10-CM | POA: Diagnosis not present

## 2015-05-06 DIAGNOSIS — G894 Chronic pain syndrome: Secondary | ICD-10-CM | POA: Diagnosis not present

## 2015-05-06 DIAGNOSIS — I83009 Varicose veins of unspecified lower extremity with ulcer of unspecified site: Secondary | ICD-10-CM | POA: Diagnosis not present

## 2015-05-06 DIAGNOSIS — E291 Testicular hypofunction: Secondary | ICD-10-CM | POA: Diagnosis not present

## 2015-05-06 DIAGNOSIS — I10 Essential (primary) hypertension: Secondary | ICD-10-CM | POA: Diagnosis not present

## 2015-05-06 DIAGNOSIS — Z7982 Long term (current) use of aspirin: Secondary | ICD-10-CM | POA: Diagnosis not present

## 2015-05-06 DIAGNOSIS — I4891 Unspecified atrial fibrillation: Secondary | ICD-10-CM | POA: Diagnosis not present

## 2015-05-09 DIAGNOSIS — G894 Chronic pain syndrome: Secondary | ICD-10-CM | POA: Diagnosis not present

## 2015-05-09 DIAGNOSIS — L97811 Non-pressure chronic ulcer of other part of right lower leg limited to breakdown of skin: Secondary | ICD-10-CM | POA: Diagnosis not present

## 2015-05-09 DIAGNOSIS — L97821 Non-pressure chronic ulcer of other part of left lower leg limited to breakdown of skin: Secondary | ICD-10-CM | POA: Diagnosis not present

## 2015-05-09 DIAGNOSIS — I4891 Unspecified atrial fibrillation: Secondary | ICD-10-CM | POA: Diagnosis not present

## 2015-05-09 DIAGNOSIS — I83009 Varicose veins of unspecified lower extremity with ulcer of unspecified site: Secondary | ICD-10-CM | POA: Diagnosis not present

## 2015-05-09 DIAGNOSIS — I10 Essential (primary) hypertension: Secondary | ICD-10-CM | POA: Diagnosis not present

## 2015-05-13 DIAGNOSIS — I83009 Varicose veins of unspecified lower extremity with ulcer of unspecified site: Secondary | ICD-10-CM | POA: Diagnosis not present

## 2015-05-13 DIAGNOSIS — L97811 Non-pressure chronic ulcer of other part of right lower leg limited to breakdown of skin: Secondary | ICD-10-CM | POA: Diagnosis not present

## 2015-05-13 DIAGNOSIS — I4891 Unspecified atrial fibrillation: Secondary | ICD-10-CM | POA: Diagnosis not present

## 2015-05-13 DIAGNOSIS — L97821 Non-pressure chronic ulcer of other part of left lower leg limited to breakdown of skin: Secondary | ICD-10-CM | POA: Diagnosis not present

## 2015-05-13 DIAGNOSIS — I10 Essential (primary) hypertension: Secondary | ICD-10-CM | POA: Diagnosis not present

## 2015-05-13 DIAGNOSIS — G894 Chronic pain syndrome: Secondary | ICD-10-CM | POA: Diagnosis not present

## 2015-05-15 DIAGNOSIS — I4891 Unspecified atrial fibrillation: Secondary | ICD-10-CM | POA: Diagnosis not present

## 2015-05-15 DIAGNOSIS — S81802A Unspecified open wound, left lower leg, initial encounter: Secondary | ICD-10-CM | POA: Diagnosis not present

## 2015-05-15 DIAGNOSIS — I10 Essential (primary) hypertension: Secondary | ICD-10-CM | POA: Diagnosis not present

## 2015-05-15 DIAGNOSIS — G894 Chronic pain syndrome: Secondary | ICD-10-CM | POA: Diagnosis not present

## 2015-05-15 DIAGNOSIS — I83009 Varicose veins of unspecified lower extremity with ulcer of unspecified site: Secondary | ICD-10-CM | POA: Diagnosis not present

## 2015-05-15 DIAGNOSIS — L97821 Non-pressure chronic ulcer of other part of left lower leg limited to breakdown of skin: Secondary | ICD-10-CM | POA: Diagnosis not present

## 2015-05-15 DIAGNOSIS — L97811 Non-pressure chronic ulcer of other part of right lower leg limited to breakdown of skin: Secondary | ICD-10-CM | POA: Diagnosis not present

## 2015-05-20 DIAGNOSIS — A499 Bacterial infection, unspecified: Secondary | ICD-10-CM | POA: Diagnosis not present

## 2015-05-20 DIAGNOSIS — E291 Testicular hypofunction: Secondary | ICD-10-CM | POA: Diagnosis not present

## 2015-05-20 DIAGNOSIS — I83009 Varicose veins of unspecified lower extremity with ulcer of unspecified site: Secondary | ICD-10-CM | POA: Diagnosis not present

## 2015-05-20 DIAGNOSIS — M549 Dorsalgia, unspecified: Secondary | ICD-10-CM | POA: Diagnosis not present

## 2015-05-20 DIAGNOSIS — L0390 Cellulitis, unspecified: Secondary | ICD-10-CM | POA: Diagnosis not present

## 2015-05-23 DIAGNOSIS — A499 Bacterial infection, unspecified: Secondary | ICD-10-CM | POA: Diagnosis not present

## 2015-05-29 DIAGNOSIS — B9562 Methicillin resistant Staphylococcus aureus infection as the cause of diseases classified elsewhere: Secondary | ICD-10-CM | POA: Diagnosis not present

## 2015-05-29 DIAGNOSIS — L97821 Non-pressure chronic ulcer of other part of left lower leg limited to breakdown of skin: Secondary | ICD-10-CM | POA: Diagnosis not present

## 2015-05-29 DIAGNOSIS — L03115 Cellulitis of right lower limb: Secondary | ICD-10-CM | POA: Diagnosis not present

## 2015-05-29 DIAGNOSIS — L97811 Non-pressure chronic ulcer of other part of right lower leg limited to breakdown of skin: Secondary | ICD-10-CM | POA: Diagnosis not present

## 2015-05-29 DIAGNOSIS — L03116 Cellulitis of left lower limb: Secondary | ICD-10-CM | POA: Diagnosis not present

## 2015-05-29 DIAGNOSIS — I872 Venous insufficiency (chronic) (peripheral): Secondary | ICD-10-CM | POA: Diagnosis not present

## 2015-06-02 DIAGNOSIS — B9562 Methicillin resistant Staphylococcus aureus infection as the cause of diseases classified elsewhere: Secondary | ICD-10-CM | POA: Diagnosis not present

## 2015-06-02 DIAGNOSIS — L97821 Non-pressure chronic ulcer of other part of left lower leg limited to breakdown of skin: Secondary | ICD-10-CM | POA: Diagnosis not present

## 2015-06-02 DIAGNOSIS — L97811 Non-pressure chronic ulcer of other part of right lower leg limited to breakdown of skin: Secondary | ICD-10-CM | POA: Diagnosis not present

## 2015-06-02 DIAGNOSIS — L03115 Cellulitis of right lower limb: Secondary | ICD-10-CM | POA: Diagnosis not present

## 2015-06-02 DIAGNOSIS — L03116 Cellulitis of left lower limb: Secondary | ICD-10-CM | POA: Diagnosis not present

## 2015-06-02 DIAGNOSIS — I872 Venous insufficiency (chronic) (peripheral): Secondary | ICD-10-CM | POA: Diagnosis not present

## 2015-06-04 DIAGNOSIS — L97811 Non-pressure chronic ulcer of other part of right lower leg limited to breakdown of skin: Secondary | ICD-10-CM | POA: Diagnosis not present

## 2015-06-04 DIAGNOSIS — B9562 Methicillin resistant Staphylococcus aureus infection as the cause of diseases classified elsewhere: Secondary | ICD-10-CM | POA: Diagnosis not present

## 2015-06-04 DIAGNOSIS — L97821 Non-pressure chronic ulcer of other part of left lower leg limited to breakdown of skin: Secondary | ICD-10-CM | POA: Diagnosis not present

## 2015-06-04 DIAGNOSIS — I872 Venous insufficiency (chronic) (peripheral): Secondary | ICD-10-CM | POA: Diagnosis not present

## 2015-06-04 DIAGNOSIS — L03116 Cellulitis of left lower limb: Secondary | ICD-10-CM | POA: Diagnosis not present

## 2015-06-04 DIAGNOSIS — L03115 Cellulitis of right lower limb: Secondary | ICD-10-CM | POA: Diagnosis not present

## 2015-06-06 DIAGNOSIS — B9562 Methicillin resistant Staphylococcus aureus infection as the cause of diseases classified elsewhere: Secondary | ICD-10-CM | POA: Diagnosis not present

## 2015-06-06 DIAGNOSIS — L97821 Non-pressure chronic ulcer of other part of left lower leg limited to breakdown of skin: Secondary | ICD-10-CM | POA: Diagnosis not present

## 2015-06-06 DIAGNOSIS — I872 Venous insufficiency (chronic) (peripheral): Secondary | ICD-10-CM | POA: Diagnosis not present

## 2015-06-06 DIAGNOSIS — L03116 Cellulitis of left lower limb: Secondary | ICD-10-CM | POA: Diagnosis not present

## 2015-06-06 DIAGNOSIS — L97811 Non-pressure chronic ulcer of other part of right lower leg limited to breakdown of skin: Secondary | ICD-10-CM | POA: Diagnosis not present

## 2015-06-06 DIAGNOSIS — L03115 Cellulitis of right lower limb: Secondary | ICD-10-CM | POA: Diagnosis not present

## 2015-06-09 DIAGNOSIS — L03116 Cellulitis of left lower limb: Secondary | ICD-10-CM | POA: Diagnosis not present

## 2015-06-09 DIAGNOSIS — I872 Venous insufficiency (chronic) (peripheral): Secondary | ICD-10-CM | POA: Diagnosis not present

## 2015-06-09 DIAGNOSIS — B9562 Methicillin resistant Staphylococcus aureus infection as the cause of diseases classified elsewhere: Secondary | ICD-10-CM | POA: Diagnosis not present

## 2015-06-09 DIAGNOSIS — L03115 Cellulitis of right lower limb: Secondary | ICD-10-CM | POA: Diagnosis not present

## 2015-06-09 DIAGNOSIS — L97811 Non-pressure chronic ulcer of other part of right lower leg limited to breakdown of skin: Secondary | ICD-10-CM | POA: Diagnosis not present

## 2015-06-09 DIAGNOSIS — L97821 Non-pressure chronic ulcer of other part of left lower leg limited to breakdown of skin: Secondary | ICD-10-CM | POA: Diagnosis not present

## 2015-06-11 DIAGNOSIS — B9562 Methicillin resistant Staphylococcus aureus infection as the cause of diseases classified elsewhere: Secondary | ICD-10-CM | POA: Diagnosis not present

## 2015-06-11 DIAGNOSIS — L97821 Non-pressure chronic ulcer of other part of left lower leg limited to breakdown of skin: Secondary | ICD-10-CM | POA: Diagnosis not present

## 2015-06-11 DIAGNOSIS — L03116 Cellulitis of left lower limb: Secondary | ICD-10-CM | POA: Diagnosis not present

## 2015-06-11 DIAGNOSIS — I872 Venous insufficiency (chronic) (peripheral): Secondary | ICD-10-CM | POA: Diagnosis not present

## 2015-06-11 DIAGNOSIS — L03115 Cellulitis of right lower limb: Secondary | ICD-10-CM | POA: Diagnosis not present

## 2015-06-11 DIAGNOSIS — L97811 Non-pressure chronic ulcer of other part of right lower leg limited to breakdown of skin: Secondary | ICD-10-CM | POA: Diagnosis not present

## 2015-06-13 DIAGNOSIS — L97811 Non-pressure chronic ulcer of other part of right lower leg limited to breakdown of skin: Secondary | ICD-10-CM | POA: Diagnosis not present

## 2015-06-13 DIAGNOSIS — L03115 Cellulitis of right lower limb: Secondary | ICD-10-CM | POA: Diagnosis not present

## 2015-06-13 DIAGNOSIS — L97821 Non-pressure chronic ulcer of other part of left lower leg limited to breakdown of skin: Secondary | ICD-10-CM | POA: Diagnosis not present

## 2015-06-13 DIAGNOSIS — L03116 Cellulitis of left lower limb: Secondary | ICD-10-CM | POA: Diagnosis not present

## 2015-06-13 DIAGNOSIS — B9562 Methicillin resistant Staphylococcus aureus infection as the cause of diseases classified elsewhere: Secondary | ICD-10-CM | POA: Diagnosis not present

## 2015-06-13 DIAGNOSIS — I872 Venous insufficiency (chronic) (peripheral): Secondary | ICD-10-CM | POA: Diagnosis not present

## 2015-06-16 DIAGNOSIS — L03116 Cellulitis of left lower limb: Secondary | ICD-10-CM | POA: Diagnosis not present

## 2015-06-16 DIAGNOSIS — I872 Venous insufficiency (chronic) (peripheral): Secondary | ICD-10-CM | POA: Diagnosis present

## 2015-06-16 DIAGNOSIS — Z6841 Body Mass Index (BMI) 40.0 and over, adult: Secondary | ICD-10-CM | POA: Diagnosis not present

## 2015-06-16 DIAGNOSIS — T361X5A Adverse effect of cephalosporins and other beta-lactam antibiotics, initial encounter: Secondary | ICD-10-CM | POA: Diagnosis not present

## 2015-06-16 DIAGNOSIS — Z452 Encounter for adjustment and management of vascular access device: Secondary | ICD-10-CM | POA: Diagnosis not present

## 2015-06-16 DIAGNOSIS — D638 Anemia in other chronic diseases classified elsewhere: Secondary | ICD-10-CM | POA: Diagnosis not present

## 2015-06-16 DIAGNOSIS — Z79899 Other long term (current) drug therapy: Secondary | ICD-10-CM | POA: Diagnosis not present

## 2015-06-16 DIAGNOSIS — B9562 Methicillin resistant Staphylococcus aureus infection as the cause of diseases classified elsewhere: Secondary | ICD-10-CM | POA: Diagnosis present

## 2015-06-16 DIAGNOSIS — B964 Proteus (mirabilis) (morganii) as the cause of diseases classified elsewhere: Secondary | ICD-10-CM | POA: Diagnosis not present

## 2015-06-16 DIAGNOSIS — L409 Psoriasis, unspecified: Secondary | ICD-10-CM | POA: Diagnosis not present

## 2015-06-16 DIAGNOSIS — Z7982 Long term (current) use of aspirin: Secondary | ICD-10-CM | POA: Diagnosis not present

## 2015-06-16 DIAGNOSIS — F17210 Nicotine dependence, cigarettes, uncomplicated: Secondary | ICD-10-CM | POA: Diagnosis present

## 2015-06-16 DIAGNOSIS — E119 Type 2 diabetes mellitus without complications: Secondary | ICD-10-CM | POA: Diagnosis present

## 2015-06-16 DIAGNOSIS — E7800 Pure hypercholesterolemia, unspecified: Secondary | ICD-10-CM | POA: Diagnosis present

## 2015-06-16 DIAGNOSIS — L97811 Non-pressure chronic ulcer of other part of right lower leg limited to breakdown of skin: Secondary | ICD-10-CM | POA: Diagnosis not present

## 2015-06-16 DIAGNOSIS — D649 Anemia, unspecified: Secondary | ICD-10-CM | POA: Diagnosis not present

## 2015-06-16 DIAGNOSIS — I10 Essential (primary) hypertension: Secondary | ICD-10-CM | POA: Diagnosis not present

## 2015-06-16 DIAGNOSIS — L03115 Cellulitis of right lower limb: Secondary | ICD-10-CM | POA: Diagnosis not present

## 2015-06-16 DIAGNOSIS — L97821 Non-pressure chronic ulcer of other part of left lower leg limited to breakdown of skin: Secondary | ICD-10-CM | POA: Diagnosis not present

## 2015-06-16 DIAGNOSIS — I4891 Unspecified atrial fibrillation: Secondary | ICD-10-CM | POA: Diagnosis not present

## 2015-06-16 DIAGNOSIS — I482 Chronic atrial fibrillation: Secondary | ICD-10-CM | POA: Diagnosis not present

## 2015-06-16 DIAGNOSIS — T3695XA Adverse effect of unspecified systemic antibiotic, initial encounter: Secondary | ICD-10-CM | POA: Diagnosis not present

## 2015-06-22 DIAGNOSIS — L97811 Non-pressure chronic ulcer of other part of right lower leg limited to breakdown of skin: Secondary | ICD-10-CM | POA: Diagnosis not present

## 2015-06-22 DIAGNOSIS — L03115 Cellulitis of right lower limb: Secondary | ICD-10-CM | POA: Diagnosis not present

## 2015-06-22 DIAGNOSIS — B9562 Methicillin resistant Staphylococcus aureus infection as the cause of diseases classified elsewhere: Secondary | ICD-10-CM | POA: Diagnosis not present

## 2015-06-22 DIAGNOSIS — L03116 Cellulitis of left lower limb: Secondary | ICD-10-CM | POA: Diagnosis not present

## 2015-06-22 DIAGNOSIS — I872 Venous insufficiency (chronic) (peripheral): Secondary | ICD-10-CM | POA: Diagnosis not present

## 2015-06-22 DIAGNOSIS — L97821 Non-pressure chronic ulcer of other part of left lower leg limited to breakdown of skin: Secondary | ICD-10-CM | POA: Diagnosis not present

## 2015-06-24 DIAGNOSIS — I872 Venous insufficiency (chronic) (peripheral): Secondary | ICD-10-CM | POA: Diagnosis not present

## 2015-06-24 DIAGNOSIS — L03115 Cellulitis of right lower limb: Secondary | ICD-10-CM | POA: Diagnosis not present

## 2015-06-24 DIAGNOSIS — L03116 Cellulitis of left lower limb: Secondary | ICD-10-CM | POA: Diagnosis not present

## 2015-06-24 DIAGNOSIS — B9562 Methicillin resistant Staphylococcus aureus infection as the cause of diseases classified elsewhere: Secondary | ICD-10-CM | POA: Diagnosis not present

## 2015-06-24 DIAGNOSIS — L97811 Non-pressure chronic ulcer of other part of right lower leg limited to breakdown of skin: Secondary | ICD-10-CM | POA: Diagnosis not present

## 2015-06-24 DIAGNOSIS — L97821 Non-pressure chronic ulcer of other part of left lower leg limited to breakdown of skin: Secondary | ICD-10-CM | POA: Diagnosis not present

## 2015-06-27 DIAGNOSIS — I10 Essential (primary) hypertension: Secondary | ICD-10-CM | POA: Diagnosis not present

## 2015-06-27 DIAGNOSIS — I872 Venous insufficiency (chronic) (peripheral): Secondary | ICD-10-CM | POA: Diagnosis not present

## 2015-06-27 DIAGNOSIS — L97811 Non-pressure chronic ulcer of other part of right lower leg limited to breakdown of skin: Secondary | ICD-10-CM | POA: Diagnosis not present

## 2015-06-27 DIAGNOSIS — I4891 Unspecified atrial fibrillation: Secondary | ICD-10-CM | POA: Diagnosis not present

## 2015-06-27 DIAGNOSIS — B9562 Methicillin resistant Staphylococcus aureus infection as the cause of diseases classified elsewhere: Secondary | ICD-10-CM | POA: Diagnosis not present

## 2015-06-27 DIAGNOSIS — L97821 Non-pressure chronic ulcer of other part of left lower leg limited to breakdown of skin: Secondary | ICD-10-CM | POA: Diagnosis not present

## 2015-06-27 DIAGNOSIS — L03116 Cellulitis of left lower limb: Secondary | ICD-10-CM | POA: Diagnosis not present

## 2015-06-27 DIAGNOSIS — R0602 Shortness of breath: Secondary | ICD-10-CM | POA: Diagnosis not present

## 2015-06-27 DIAGNOSIS — I8310 Varicose veins of unspecified lower extremity with inflammation: Secondary | ICD-10-CM | POA: Diagnosis not present

## 2015-06-27 DIAGNOSIS — L03115 Cellulitis of right lower limb: Secondary | ICD-10-CM | POA: Diagnosis not present

## 2015-06-27 DIAGNOSIS — I83009 Varicose veins of unspecified lower extremity with ulcer of unspecified site: Secondary | ICD-10-CM | POA: Diagnosis not present

## 2015-06-30 DIAGNOSIS — L97821 Non-pressure chronic ulcer of other part of left lower leg limited to breakdown of skin: Secondary | ICD-10-CM | POA: Diagnosis not present

## 2015-06-30 DIAGNOSIS — L03116 Cellulitis of left lower limb: Secondary | ICD-10-CM | POA: Diagnosis not present

## 2015-06-30 DIAGNOSIS — L03115 Cellulitis of right lower limb: Secondary | ICD-10-CM | POA: Diagnosis not present

## 2015-06-30 DIAGNOSIS — B9562 Methicillin resistant Staphylococcus aureus infection as the cause of diseases classified elsewhere: Secondary | ICD-10-CM | POA: Diagnosis not present

## 2015-06-30 DIAGNOSIS — I872 Venous insufficiency (chronic) (peripheral): Secondary | ICD-10-CM | POA: Diagnosis not present

## 2015-06-30 DIAGNOSIS — L97811 Non-pressure chronic ulcer of other part of right lower leg limited to breakdown of skin: Secondary | ICD-10-CM | POA: Diagnosis not present

## 2015-07-02 DIAGNOSIS — L03116 Cellulitis of left lower limb: Secondary | ICD-10-CM | POA: Diagnosis not present

## 2015-07-02 DIAGNOSIS — I872 Venous insufficiency (chronic) (peripheral): Secondary | ICD-10-CM | POA: Diagnosis not present

## 2015-07-02 DIAGNOSIS — B9562 Methicillin resistant Staphylococcus aureus infection as the cause of diseases classified elsewhere: Secondary | ICD-10-CM | POA: Diagnosis not present

## 2015-07-02 DIAGNOSIS — L97811 Non-pressure chronic ulcer of other part of right lower leg limited to breakdown of skin: Secondary | ICD-10-CM | POA: Diagnosis not present

## 2015-07-02 DIAGNOSIS — L97821 Non-pressure chronic ulcer of other part of left lower leg limited to breakdown of skin: Secondary | ICD-10-CM | POA: Diagnosis not present

## 2015-07-02 DIAGNOSIS — L03115 Cellulitis of right lower limb: Secondary | ICD-10-CM | POA: Diagnosis not present

## 2015-07-04 DIAGNOSIS — L03116 Cellulitis of left lower limb: Secondary | ICD-10-CM | POA: Diagnosis not present

## 2015-07-04 DIAGNOSIS — I872 Venous insufficiency (chronic) (peripheral): Secondary | ICD-10-CM | POA: Diagnosis not present

## 2015-07-04 DIAGNOSIS — B9562 Methicillin resistant Staphylococcus aureus infection as the cause of diseases classified elsewhere: Secondary | ICD-10-CM | POA: Diagnosis not present

## 2015-07-04 DIAGNOSIS — L97821 Non-pressure chronic ulcer of other part of left lower leg limited to breakdown of skin: Secondary | ICD-10-CM | POA: Diagnosis not present

## 2015-07-04 DIAGNOSIS — L03115 Cellulitis of right lower limb: Secondary | ICD-10-CM | POA: Diagnosis not present

## 2015-07-04 DIAGNOSIS — L97811 Non-pressure chronic ulcer of other part of right lower leg limited to breakdown of skin: Secondary | ICD-10-CM | POA: Diagnosis not present

## 2015-07-07 DIAGNOSIS — L03115 Cellulitis of right lower limb: Secondary | ICD-10-CM | POA: Diagnosis not present

## 2015-07-07 DIAGNOSIS — I872 Venous insufficiency (chronic) (peripheral): Secondary | ICD-10-CM | POA: Diagnosis not present

## 2015-07-07 DIAGNOSIS — L97811 Non-pressure chronic ulcer of other part of right lower leg limited to breakdown of skin: Secondary | ICD-10-CM | POA: Diagnosis not present

## 2015-07-07 DIAGNOSIS — L97821 Non-pressure chronic ulcer of other part of left lower leg limited to breakdown of skin: Secondary | ICD-10-CM | POA: Diagnosis not present

## 2015-07-07 DIAGNOSIS — L03116 Cellulitis of left lower limb: Secondary | ICD-10-CM | POA: Diagnosis not present

## 2015-07-07 DIAGNOSIS — B9562 Methicillin resistant Staphylococcus aureus infection as the cause of diseases classified elsewhere: Secondary | ICD-10-CM | POA: Diagnosis not present

## 2015-07-09 DIAGNOSIS — L03115 Cellulitis of right lower limb: Secondary | ICD-10-CM | POA: Diagnosis not present

## 2015-07-09 DIAGNOSIS — L97821 Non-pressure chronic ulcer of other part of left lower leg limited to breakdown of skin: Secondary | ICD-10-CM | POA: Diagnosis not present

## 2015-07-09 DIAGNOSIS — B9562 Methicillin resistant Staphylococcus aureus infection as the cause of diseases classified elsewhere: Secondary | ICD-10-CM | POA: Diagnosis not present

## 2015-07-09 DIAGNOSIS — I872 Venous insufficiency (chronic) (peripheral): Secondary | ICD-10-CM | POA: Diagnosis not present

## 2015-07-09 DIAGNOSIS — L97811 Non-pressure chronic ulcer of other part of right lower leg limited to breakdown of skin: Secondary | ICD-10-CM | POA: Diagnosis not present

## 2015-07-09 DIAGNOSIS — L03116 Cellulitis of left lower limb: Secondary | ICD-10-CM | POA: Diagnosis not present

## 2015-07-11 DIAGNOSIS — B9562 Methicillin resistant Staphylococcus aureus infection as the cause of diseases classified elsewhere: Secondary | ICD-10-CM | POA: Diagnosis not present

## 2015-07-11 DIAGNOSIS — L97821 Non-pressure chronic ulcer of other part of left lower leg limited to breakdown of skin: Secondary | ICD-10-CM | POA: Diagnosis not present

## 2015-07-11 DIAGNOSIS — L03115 Cellulitis of right lower limb: Secondary | ICD-10-CM | POA: Diagnosis not present

## 2015-07-11 DIAGNOSIS — I872 Venous insufficiency (chronic) (peripheral): Secondary | ICD-10-CM | POA: Diagnosis not present

## 2015-07-11 DIAGNOSIS — L97811 Non-pressure chronic ulcer of other part of right lower leg limited to breakdown of skin: Secondary | ICD-10-CM | POA: Diagnosis not present

## 2015-07-11 DIAGNOSIS — L03116 Cellulitis of left lower limb: Secondary | ICD-10-CM | POA: Diagnosis not present

## 2015-07-14 DIAGNOSIS — I872 Venous insufficiency (chronic) (peripheral): Secondary | ICD-10-CM | POA: Diagnosis not present

## 2015-07-14 DIAGNOSIS — L97821 Non-pressure chronic ulcer of other part of left lower leg limited to breakdown of skin: Secondary | ICD-10-CM | POA: Diagnosis not present

## 2015-07-14 DIAGNOSIS — B9562 Methicillin resistant Staphylococcus aureus infection as the cause of diseases classified elsewhere: Secondary | ICD-10-CM | POA: Diagnosis not present

## 2015-07-14 DIAGNOSIS — L03115 Cellulitis of right lower limb: Secondary | ICD-10-CM | POA: Diagnosis not present

## 2015-07-14 DIAGNOSIS — L97811 Non-pressure chronic ulcer of other part of right lower leg limited to breakdown of skin: Secondary | ICD-10-CM | POA: Diagnosis not present

## 2015-07-14 DIAGNOSIS — L03116 Cellulitis of left lower limb: Secondary | ICD-10-CM | POA: Diagnosis not present

## 2015-07-16 DIAGNOSIS — L03115 Cellulitis of right lower limb: Secondary | ICD-10-CM | POA: Diagnosis not present

## 2015-07-16 DIAGNOSIS — L03116 Cellulitis of left lower limb: Secondary | ICD-10-CM | POA: Diagnosis not present

## 2015-07-16 DIAGNOSIS — L97821 Non-pressure chronic ulcer of other part of left lower leg limited to breakdown of skin: Secondary | ICD-10-CM | POA: Diagnosis not present

## 2015-07-16 DIAGNOSIS — B9562 Methicillin resistant Staphylococcus aureus infection as the cause of diseases classified elsewhere: Secondary | ICD-10-CM | POA: Diagnosis not present

## 2015-07-16 DIAGNOSIS — L97811 Non-pressure chronic ulcer of other part of right lower leg limited to breakdown of skin: Secondary | ICD-10-CM | POA: Diagnosis not present

## 2015-07-16 DIAGNOSIS — I872 Venous insufficiency (chronic) (peripheral): Secondary | ICD-10-CM | POA: Diagnosis not present

## 2015-07-17 DIAGNOSIS — L97821 Non-pressure chronic ulcer of other part of left lower leg limited to breakdown of skin: Secondary | ICD-10-CM | POA: Diagnosis not present

## 2015-07-17 DIAGNOSIS — L03115 Cellulitis of right lower limb: Secondary | ICD-10-CM | POA: Diagnosis not present

## 2015-07-17 DIAGNOSIS — L03116 Cellulitis of left lower limb: Secondary | ICD-10-CM | POA: Diagnosis not present

## 2015-07-17 DIAGNOSIS — I872 Venous insufficiency (chronic) (peripheral): Secondary | ICD-10-CM | POA: Diagnosis not present

## 2015-07-17 DIAGNOSIS — L97811 Non-pressure chronic ulcer of other part of right lower leg limited to breakdown of skin: Secondary | ICD-10-CM | POA: Diagnosis not present

## 2015-07-17 DIAGNOSIS — B9562 Methicillin resistant Staphylococcus aureus infection as the cause of diseases classified elsewhere: Secondary | ICD-10-CM | POA: Diagnosis not present

## 2015-07-18 DIAGNOSIS — L97821 Non-pressure chronic ulcer of other part of left lower leg limited to breakdown of skin: Secondary | ICD-10-CM | POA: Diagnosis not present

## 2015-07-18 DIAGNOSIS — B9562 Methicillin resistant Staphylococcus aureus infection as the cause of diseases classified elsewhere: Secondary | ICD-10-CM | POA: Diagnosis not present

## 2015-07-18 DIAGNOSIS — L03116 Cellulitis of left lower limb: Secondary | ICD-10-CM | POA: Diagnosis not present

## 2015-07-18 DIAGNOSIS — L03115 Cellulitis of right lower limb: Secondary | ICD-10-CM | POA: Diagnosis not present

## 2015-07-18 DIAGNOSIS — L97811 Non-pressure chronic ulcer of other part of right lower leg limited to breakdown of skin: Secondary | ICD-10-CM | POA: Diagnosis not present

## 2015-07-18 DIAGNOSIS — I872 Venous insufficiency (chronic) (peripheral): Secondary | ICD-10-CM | POA: Diagnosis not present

## 2015-07-21 DIAGNOSIS — L03116 Cellulitis of left lower limb: Secondary | ICD-10-CM | POA: Diagnosis not present

## 2015-07-21 DIAGNOSIS — I872 Venous insufficiency (chronic) (peripheral): Secondary | ICD-10-CM | POA: Diagnosis not present

## 2015-07-21 DIAGNOSIS — L97821 Non-pressure chronic ulcer of other part of left lower leg limited to breakdown of skin: Secondary | ICD-10-CM | POA: Diagnosis not present

## 2015-07-21 DIAGNOSIS — L03115 Cellulitis of right lower limb: Secondary | ICD-10-CM | POA: Diagnosis not present

## 2015-07-21 DIAGNOSIS — L97811 Non-pressure chronic ulcer of other part of right lower leg limited to breakdown of skin: Secondary | ICD-10-CM | POA: Diagnosis not present

## 2015-07-21 DIAGNOSIS — B9562 Methicillin resistant Staphylococcus aureus infection as the cause of diseases classified elsewhere: Secondary | ICD-10-CM | POA: Diagnosis not present

## 2015-07-23 DIAGNOSIS — I872 Venous insufficiency (chronic) (peripheral): Secondary | ICD-10-CM | POA: Diagnosis not present

## 2015-07-23 DIAGNOSIS — L03116 Cellulitis of left lower limb: Secondary | ICD-10-CM | POA: Diagnosis not present

## 2015-07-23 DIAGNOSIS — B9562 Methicillin resistant Staphylococcus aureus infection as the cause of diseases classified elsewhere: Secondary | ICD-10-CM | POA: Diagnosis not present

## 2015-07-23 DIAGNOSIS — L03115 Cellulitis of right lower limb: Secondary | ICD-10-CM | POA: Diagnosis not present

## 2015-07-23 DIAGNOSIS — L97811 Non-pressure chronic ulcer of other part of right lower leg limited to breakdown of skin: Secondary | ICD-10-CM | POA: Diagnosis not present

## 2015-07-23 DIAGNOSIS — L97821 Non-pressure chronic ulcer of other part of left lower leg limited to breakdown of skin: Secondary | ICD-10-CM | POA: Diagnosis not present

## 2015-07-28 DIAGNOSIS — L97821 Non-pressure chronic ulcer of other part of left lower leg limited to breakdown of skin: Secondary | ICD-10-CM | POA: Diagnosis not present

## 2015-07-28 DIAGNOSIS — I4891 Unspecified atrial fibrillation: Secondary | ICD-10-CM | POA: Diagnosis not present

## 2015-07-28 DIAGNOSIS — I10 Essential (primary) hypertension: Secondary | ICD-10-CM | POA: Diagnosis not present

## 2015-07-28 DIAGNOSIS — I872 Venous insufficiency (chronic) (peripheral): Secondary | ICD-10-CM | POA: Diagnosis not present

## 2015-07-28 DIAGNOSIS — G894 Chronic pain syndrome: Secondary | ICD-10-CM | POA: Diagnosis not present

## 2015-07-28 DIAGNOSIS — L97811 Non-pressure chronic ulcer of other part of right lower leg limited to breakdown of skin: Secondary | ICD-10-CM | POA: Diagnosis not present

## 2015-07-30 DIAGNOSIS — L97811 Non-pressure chronic ulcer of other part of right lower leg limited to breakdown of skin: Secondary | ICD-10-CM | POA: Diagnosis not present

## 2015-07-30 DIAGNOSIS — I10 Essential (primary) hypertension: Secondary | ICD-10-CM | POA: Diagnosis not present

## 2015-07-30 DIAGNOSIS — I4891 Unspecified atrial fibrillation: Secondary | ICD-10-CM | POA: Diagnosis not present

## 2015-07-30 DIAGNOSIS — L97821 Non-pressure chronic ulcer of other part of left lower leg limited to breakdown of skin: Secondary | ICD-10-CM | POA: Diagnosis not present

## 2015-07-30 DIAGNOSIS — G894 Chronic pain syndrome: Secondary | ICD-10-CM | POA: Diagnosis not present

## 2015-07-30 DIAGNOSIS — I872 Venous insufficiency (chronic) (peripheral): Secondary | ICD-10-CM | POA: Diagnosis not present

## 2015-07-31 DIAGNOSIS — L409 Psoriasis, unspecified: Secondary | ICD-10-CM | POA: Diagnosis not present

## 2015-07-31 DIAGNOSIS — R0602 Shortness of breath: Secondary | ICD-10-CM | POA: Diagnosis not present

## 2015-07-31 DIAGNOSIS — I4891 Unspecified atrial fibrillation: Secondary | ICD-10-CM | POA: Diagnosis not present

## 2015-07-31 DIAGNOSIS — I10 Essential (primary) hypertension: Secondary | ICD-10-CM | POA: Diagnosis not present

## 2015-07-31 DIAGNOSIS — E291 Testicular hypofunction: Secondary | ICD-10-CM | POA: Diagnosis not present

## 2015-07-31 DIAGNOSIS — I83009 Varicose veins of unspecified lower extremity with ulcer of unspecified site: Secondary | ICD-10-CM | POA: Diagnosis not present

## 2015-07-31 DIAGNOSIS — M549 Dorsalgia, unspecified: Secondary | ICD-10-CM | POA: Diagnosis not present

## 2015-08-01 DIAGNOSIS — I872 Venous insufficiency (chronic) (peripheral): Secondary | ICD-10-CM | POA: Diagnosis not present

## 2015-08-01 DIAGNOSIS — G894 Chronic pain syndrome: Secondary | ICD-10-CM | POA: Diagnosis not present

## 2015-08-01 DIAGNOSIS — L97821 Non-pressure chronic ulcer of other part of left lower leg limited to breakdown of skin: Secondary | ICD-10-CM | POA: Diagnosis not present

## 2015-08-01 DIAGNOSIS — I10 Essential (primary) hypertension: Secondary | ICD-10-CM | POA: Diagnosis not present

## 2015-08-01 DIAGNOSIS — L97811 Non-pressure chronic ulcer of other part of right lower leg limited to breakdown of skin: Secondary | ICD-10-CM | POA: Diagnosis not present

## 2015-08-01 DIAGNOSIS — I4891 Unspecified atrial fibrillation: Secondary | ICD-10-CM | POA: Diagnosis not present

## 2015-08-04 DIAGNOSIS — I872 Venous insufficiency (chronic) (peripheral): Secondary | ICD-10-CM | POA: Diagnosis not present

## 2015-08-04 DIAGNOSIS — I10 Essential (primary) hypertension: Secondary | ICD-10-CM | POA: Diagnosis not present

## 2015-08-04 DIAGNOSIS — G894 Chronic pain syndrome: Secondary | ICD-10-CM | POA: Diagnosis not present

## 2015-08-04 DIAGNOSIS — L97811 Non-pressure chronic ulcer of other part of right lower leg limited to breakdown of skin: Secondary | ICD-10-CM | POA: Diagnosis not present

## 2015-08-04 DIAGNOSIS — I4891 Unspecified atrial fibrillation: Secondary | ICD-10-CM | POA: Diagnosis not present

## 2015-08-04 DIAGNOSIS — L97821 Non-pressure chronic ulcer of other part of left lower leg limited to breakdown of skin: Secondary | ICD-10-CM | POA: Diagnosis not present

## 2015-08-06 DIAGNOSIS — I10 Essential (primary) hypertension: Secondary | ICD-10-CM | POA: Diagnosis not present

## 2015-08-06 DIAGNOSIS — G894 Chronic pain syndrome: Secondary | ICD-10-CM | POA: Diagnosis not present

## 2015-08-06 DIAGNOSIS — L97821 Non-pressure chronic ulcer of other part of left lower leg limited to breakdown of skin: Secondary | ICD-10-CM | POA: Diagnosis not present

## 2015-08-06 DIAGNOSIS — I872 Venous insufficiency (chronic) (peripheral): Secondary | ICD-10-CM | POA: Diagnosis not present

## 2015-08-06 DIAGNOSIS — I4891 Unspecified atrial fibrillation: Secondary | ICD-10-CM | POA: Diagnosis not present

## 2015-08-06 DIAGNOSIS — L97811 Non-pressure chronic ulcer of other part of right lower leg limited to breakdown of skin: Secondary | ICD-10-CM | POA: Diagnosis not present

## 2015-08-08 ENCOUNTER — Encounter: Payer: Self-pay | Admitting: Physical Medicine & Rehabilitation

## 2015-08-08 DIAGNOSIS — L97811 Non-pressure chronic ulcer of other part of right lower leg limited to breakdown of skin: Secondary | ICD-10-CM | POA: Diagnosis not present

## 2015-08-08 DIAGNOSIS — L97821 Non-pressure chronic ulcer of other part of left lower leg limited to breakdown of skin: Secondary | ICD-10-CM | POA: Diagnosis not present

## 2015-08-08 DIAGNOSIS — I872 Venous insufficiency (chronic) (peripheral): Secondary | ICD-10-CM | POA: Diagnosis not present

## 2015-08-08 DIAGNOSIS — G894 Chronic pain syndrome: Secondary | ICD-10-CM | POA: Diagnosis not present

## 2015-08-08 DIAGNOSIS — I10 Essential (primary) hypertension: Secondary | ICD-10-CM | POA: Diagnosis not present

## 2015-08-08 DIAGNOSIS — I4891 Unspecified atrial fibrillation: Secondary | ICD-10-CM | POA: Diagnosis not present

## 2015-08-11 DIAGNOSIS — I10 Essential (primary) hypertension: Secondary | ICD-10-CM | POA: Diagnosis not present

## 2015-08-11 DIAGNOSIS — L97811 Non-pressure chronic ulcer of other part of right lower leg limited to breakdown of skin: Secondary | ICD-10-CM | POA: Diagnosis not present

## 2015-08-11 DIAGNOSIS — G894 Chronic pain syndrome: Secondary | ICD-10-CM | POA: Diagnosis not present

## 2015-08-11 DIAGNOSIS — I872 Venous insufficiency (chronic) (peripheral): Secondary | ICD-10-CM | POA: Diagnosis not present

## 2015-08-11 DIAGNOSIS — L97821 Non-pressure chronic ulcer of other part of left lower leg limited to breakdown of skin: Secondary | ICD-10-CM | POA: Diagnosis not present

## 2015-08-11 DIAGNOSIS — I4891 Unspecified atrial fibrillation: Secondary | ICD-10-CM | POA: Diagnosis not present

## 2015-08-13 DIAGNOSIS — L97811 Non-pressure chronic ulcer of other part of right lower leg limited to breakdown of skin: Secondary | ICD-10-CM | POA: Diagnosis not present

## 2015-08-13 DIAGNOSIS — L97821 Non-pressure chronic ulcer of other part of left lower leg limited to breakdown of skin: Secondary | ICD-10-CM | POA: Diagnosis not present

## 2015-08-13 DIAGNOSIS — I872 Venous insufficiency (chronic) (peripheral): Secondary | ICD-10-CM | POA: Diagnosis not present

## 2015-08-13 DIAGNOSIS — I10 Essential (primary) hypertension: Secondary | ICD-10-CM | POA: Diagnosis not present

## 2015-08-13 DIAGNOSIS — I4891 Unspecified atrial fibrillation: Secondary | ICD-10-CM | POA: Diagnosis not present

## 2015-08-13 DIAGNOSIS — G894 Chronic pain syndrome: Secondary | ICD-10-CM | POA: Diagnosis not present

## 2015-08-15 DIAGNOSIS — L97821 Non-pressure chronic ulcer of other part of left lower leg limited to breakdown of skin: Secondary | ICD-10-CM | POA: Diagnosis not present

## 2015-08-15 DIAGNOSIS — I872 Venous insufficiency (chronic) (peripheral): Secondary | ICD-10-CM | POA: Diagnosis not present

## 2015-08-15 DIAGNOSIS — L97811 Non-pressure chronic ulcer of other part of right lower leg limited to breakdown of skin: Secondary | ICD-10-CM | POA: Diagnosis not present

## 2015-08-15 DIAGNOSIS — I4891 Unspecified atrial fibrillation: Secondary | ICD-10-CM | POA: Diagnosis not present

## 2015-08-15 DIAGNOSIS — G894 Chronic pain syndrome: Secondary | ICD-10-CM | POA: Diagnosis not present

## 2015-08-15 DIAGNOSIS — I10 Essential (primary) hypertension: Secondary | ICD-10-CM | POA: Diagnosis not present

## 2015-08-18 DIAGNOSIS — I10 Essential (primary) hypertension: Secondary | ICD-10-CM | POA: Diagnosis not present

## 2015-08-18 DIAGNOSIS — I4891 Unspecified atrial fibrillation: Secondary | ICD-10-CM | POA: Diagnosis not present

## 2015-08-18 DIAGNOSIS — L97811 Non-pressure chronic ulcer of other part of right lower leg limited to breakdown of skin: Secondary | ICD-10-CM | POA: Diagnosis not present

## 2015-08-18 DIAGNOSIS — I872 Venous insufficiency (chronic) (peripheral): Secondary | ICD-10-CM | POA: Diagnosis not present

## 2015-08-18 DIAGNOSIS — L97821 Non-pressure chronic ulcer of other part of left lower leg limited to breakdown of skin: Secondary | ICD-10-CM | POA: Diagnosis not present

## 2015-08-18 DIAGNOSIS — G894 Chronic pain syndrome: Secondary | ICD-10-CM | POA: Diagnosis not present

## 2015-08-20 DIAGNOSIS — I4891 Unspecified atrial fibrillation: Secondary | ICD-10-CM | POA: Diagnosis not present

## 2015-08-20 DIAGNOSIS — I10 Essential (primary) hypertension: Secondary | ICD-10-CM | POA: Diagnosis not present

## 2015-08-20 DIAGNOSIS — G894 Chronic pain syndrome: Secondary | ICD-10-CM | POA: Diagnosis not present

## 2015-08-20 DIAGNOSIS — I872 Venous insufficiency (chronic) (peripheral): Secondary | ICD-10-CM | POA: Diagnosis not present

## 2015-08-20 DIAGNOSIS — L97811 Non-pressure chronic ulcer of other part of right lower leg limited to breakdown of skin: Secondary | ICD-10-CM | POA: Diagnosis not present

## 2015-08-20 DIAGNOSIS — L97821 Non-pressure chronic ulcer of other part of left lower leg limited to breakdown of skin: Secondary | ICD-10-CM | POA: Diagnosis not present

## 2015-08-21 ENCOUNTER — Encounter: Payer: Medicare Other | Attending: Physical Medicine & Rehabilitation | Admitting: Physical Medicine & Rehabilitation

## 2015-08-21 ENCOUNTER — Encounter: Payer: Self-pay | Admitting: Physical Medicine & Rehabilitation

## 2015-08-21 VITALS — BP 159/85 | HR 78 | Resp 14

## 2015-08-21 DIAGNOSIS — G4700 Insomnia, unspecified: Secondary | ICD-10-CM

## 2015-08-21 DIAGNOSIS — Z79899 Other long term (current) drug therapy: Secondary | ICD-10-CM

## 2015-08-21 DIAGNOSIS — G894 Chronic pain syndrome: Secondary | ICD-10-CM

## 2015-08-21 DIAGNOSIS — I110 Hypertensive heart disease with heart failure: Secondary | ICD-10-CM | POA: Insufficient documentation

## 2015-08-21 DIAGNOSIS — I4891 Unspecified atrial fibrillation: Secondary | ICD-10-CM | POA: Insufficient documentation

## 2015-08-21 DIAGNOSIS — Z5181 Encounter for therapeutic drug level monitoring: Secondary | ICD-10-CM | POA: Diagnosis not present

## 2015-08-21 DIAGNOSIS — I4819 Other persistent atrial fibrillation: Secondary | ICD-10-CM | POA: Insufficient documentation

## 2015-08-21 DIAGNOSIS — R269 Unspecified abnormalities of gait and mobility: Secondary | ICD-10-CM

## 2015-08-21 DIAGNOSIS — I482 Chronic atrial fibrillation, unspecified: Secondary | ICD-10-CM

## 2015-08-21 DIAGNOSIS — S81802A Unspecified open wound, left lower leg, initial encounter: Secondary | ICD-10-CM | POA: Insufficient documentation

## 2015-08-21 DIAGNOSIS — L0390 Cellulitis, unspecified: Secondary | ICD-10-CM

## 2015-08-21 DIAGNOSIS — I10 Essential (primary) hypertension: Secondary | ICD-10-CM

## 2015-08-21 DIAGNOSIS — G4733 Obstructive sleep apnea (adult) (pediatric): Secondary | ICD-10-CM

## 2015-08-21 DIAGNOSIS — M545 Low back pain: Secondary | ICD-10-CM | POA: Insufficient documentation

## 2015-08-21 HISTORY — DX: Essential (primary) hypertension: I10

## 2015-08-21 HISTORY — DX: Unspecified atrial fibrillation: I48.91

## 2015-08-21 HISTORY — DX: Insomnia, unspecified: G47.00

## 2015-08-21 HISTORY — DX: Obstructive sleep apnea (adult) (pediatric): G47.33

## 2015-08-21 MED ORDER — METHOCARBAMOL 500 MG PO TABS: 500.0000 mg | ORAL_TABLET | Freq: Four times a day (QID) | ORAL | Status: DC | PRN

## 2015-08-21 MED ORDER — AMITRIPTYLINE HCL 150 MG PO TABS: 150.0000 mg | ORAL_TABLET | Freq: Every day | ORAL | Status: DC

## 2015-08-21 NOTE — Progress Notes (Signed)
Subjective:    Patient ID: Tyler Mckinney, male    DOB: [birth year], 60 y.o.   MRN: [id number]  HPI 60 y/o male with pmh of A.fib, OSA, HTN and psh of debridement of LLE (managed by PCP) and several visits to [facility] presents with pain in his lower back.  The pain radiates from his back to the anterior aspect of his legs to his distal knees.  Getting progressively worse.  It is sharp and burning. He has herniated L2-4 after an accident and that is when the pain started.  Nucynta appears to improve.  Any activity exacerbates the pain. 7/10 in severity today.  Pain is constant.  He has associated insomnia, numbness along the anterior aspect of his leg.  He has had ESIs in the past with good results at times, last one in 2013.   He had an MRI ~1 year ago, showing stable discs, but severe stenosis (per pt). He uses a rollator due to pain and endurance.   Pain Inventory Average Pain 7 Pain Right Now 7 My pain is constant, sharp, burning, dull, stabbing, tingling and aching  In the last 24 hours, has pain interfered with the following? General activity 8 Relation with others 8 Enjoyment of life 10 What TIME of day is your pain at its worst? all Sleep (in general) Poor  Pain is worse with: walking, bending, sitting, inactivity, standing and some activites Pain improves with: medication and injections Relief from Meds: 8  Mobility walk with assistance use a walker how many minutes can you walk? 2-5 do you drive?  no Do you have any goals in this area?  yes  Function disabled: date disabled . retired Do you have any goals in this area?  no  Neuro/Psych weakness numbness tingling trouble walking  Prior Studies x-rays CT/MRI new visit  Physicians involved in your care Primary care Dr. Lonie Peak new visit   Family History  Problem Relation Age of Onset  . Arthritis Mother   . Cancer Mother   . Miscarriages / [place] Mother   . Arthritis Father    . Diabetes Father    Social History   Social History  . Marital Status: Unknown    Spouse Name: N/A  . Number of Children: N/A  . Years of Education: N/A   Social History Main Topics  . Smoking status: Never Smoker   . Smokeless tobacco: Not on file  . Alcohol Use: Not on file  . Drug Use: Not on file  . Sexual Activity: Not on file   Other Topics Concern  . Not on file   Social History Narrative  . No narrative on file   Past Surgical History  Procedure Laterality Date  . Hernia repair    . Leg surgery Left     wound debridement  . Fracture surgery     Past Medical History  Diagnosis Date  . Allergy   . Arthritis   . CHF (congestive heart failure) (HCC)   . Sleep apnea   . Hypertension    BP 159/85 mmHg  Pulse 78  Resp 14  SpO2 100%  Opioid Risk Score:   Fall Risk Score:  `1  Depression screen PHQ 2/9  Depression screen PHQ 2/9 08/21/2015  Decreased Interest 3  Down, Depressed, Hopeless 0  PHQ - 2 Score 3  Altered sleeping 3  Tired, decreased energy 3  Change in appetite 1  Feeling bad or failure about yourself  0  Trouble concentrating 0  Moving slowly or fidgety/restless 0  Suicidal thoughts 0  PHQ-9 Score 10  Difficult doing work/chores Very difficult   Outpatient Encounter Prescriptions as of 08/21/2015  Medication Sig  . acetaminophen (TYLENOL) 500 MG tablet Take 500 mg by mouth as needed.  . Ascorbic Acid (VITAMIN C) 1000 MG tablet Take 1,000 mg by mouth daily.  Marland Kitchen aspirin 325 MG tablet Take 325 mg by mouth daily.  Marland Kitchen b complex vitamins tablet Take 1 tablet by mouth daily.  . celecoxib (CELEBREX) 200 MG capsule Take 200 mg by mouth 2 (two) times daily.  . ciprofloxacin (CIPRO) 500 MG tablet Take 500 mg by mouth 2 (two) times daily.  . cloNIDine (CATAPRES) 0.3 MG tablet Take 0.3 mg by mouth 2 (two) times daily.  . cyclobenzaprine (FLEXERIL) 10 MG tablet Take 10 mg by mouth 3 (three) times daily as needed for muscle spasms.  . DULoxetine  (CYMBALTA) 60 MG capsule Take 60 mg by mouth daily.  . furosemide (LASIX) 40 MG tablet Take 60 mg by mouth daily.  Marland Kitchen ibuprofen (ADVIL,MOTRIN) 200 MG tablet Take 200 mg by mouth as needed.  Marland Kitchen lisinopril (PRINIVIL,ZESTRIL) 40 MG tablet Take 40 mg by mouth 2 (two) times daily.  . metoprolol (LOPRESSOR) 100 MG tablet Take 100 mg by mouth 2 (two) times daily.  . potassium chloride (K-DUR,KLOR-CON) 10 MEQ tablet Take 10 mEq by mouth daily.  . ranitidine (ZANTAC) 150 MG tablet Take 150 mg by mouth 2 (two) times daily.  Marland Kitchen sulfamethoxazole-trimethoprim (BACTRIM DS,SEPTRA DS) 800-160 MG tablet Take 1 tablet by mouth 2 (two) times daily.  Marland Kitchen testosterone cypionate (DEPOTESTOSTERONE CYPIONATE) 200 MG/ML injection Inject into the muscle every 14 (fourteen) days.   No facility-administered encounter medications on file as of 08/21/2015.    Review of Systems  Cardiovascular: Positive for leg swelling.  Musculoskeletal: Positive for back pain, arthralgias and gait problem.  All other systems reviewed and are negative.     Objective:   Physical Exam  BP 159/85 mmHg  Pulse 78  Resp 14  SpO2 100% HENT: Normocephalic, Atraumatic Eyes: EOMI, Conj WNL Cardio: Irregularly irregular  Pulm: B/l clear to auscultation.  Effort normal Abd: +Obese. Soft, non-distended, non-tender, BS+ MSK:  Gait antalgic.   LLE edema.  Neuro: CN II-XII grossly intact.    Sensation intact to light touch in all LE dermatomes, with exception of small anterior portion of left thigh  Reflexes ?1+ (limited by body habitus) throughout  Strength  4/5 in b/l hip flexion, knee extension    5/5 ankle dorsi/plantar flexion  ?+B/l SLR Skin: LLE wound with dressing c/d/i.     Assessment & Plan:  60 y/o male with pmh of A.fib, OSA, HTN and psh of debridement of LLE (managed by PCP) and several visits to Vascular and Wound centers presents with pain in his lower back.  1. Back pain radiating to B/l LE  Per pt, L2-4 herniated discs.  He  had an MRI ~1 year ago, showing stable discs, but severe stenosis (per pt).  Pt had PT without benefit  Pt cannot get to a pool due to his leg wound  Cont Cymbalta 60 per PCP  Referral for PT for strength, endurance, and TENs unit  Will hold off on ESI for time being due to difficulty with wound healing, however, will consider in future  Will prescribe elavil  Will d/c flexaril and change to robaxin prn  Will consider Nucynta on next visit  Will also  discuss acupuncture on next visit  2. Abnormality of gait  Cont rollator for safety  3. Insomnia  Will prescribe elavil  4. ?Maralgia Paresthetica  The symptoms may be related to the stenosis in his back, however, this could be a double crush injury.  Will cont to follow  5. Morbid obesity  Cont weight loss  6. LLE wound  Cont care per PCP  7. OSA  Cont CPAP

## 2015-08-22 ENCOUNTER — Telehealth: Payer: Self-pay | Admitting: *Deleted

## 2015-08-22 DIAGNOSIS — I83009 Varicose veins of unspecified lower extremity with ulcer of unspecified site: Secondary | ICD-10-CM | POA: Diagnosis not present

## 2015-08-22 NOTE — Telephone Encounter (Signed)
I contacted patient to confirm ID#. I tried covermymeds and it still did not work.  I called [facility] and prior authorization was approved over the phone for methocarbomal 500 mg.  I called the pharmacy and patient to inform

## 2015-08-22 NOTE — Telephone Encounter (Signed)
Left a message for [name] to call us back with his Part D medication coverage plan.  I tried to file with [facility] and it is saying that the information (address and phone) do not match their records so it could not be processed.

## 2015-08-23 DIAGNOSIS — L97821 Non-pressure chronic ulcer of other part of left lower leg limited to breakdown of skin: Secondary | ICD-10-CM | POA: Diagnosis not present

## 2015-08-23 DIAGNOSIS — I872 Venous insufficiency (chronic) (peripheral): Secondary | ICD-10-CM | POA: Diagnosis not present

## 2015-08-23 DIAGNOSIS — G894 Chronic pain syndrome: Secondary | ICD-10-CM | POA: Diagnosis not present

## 2015-08-23 DIAGNOSIS — L97811 Non-pressure chronic ulcer of other part of right lower leg limited to breakdown of skin: Secondary | ICD-10-CM | POA: Diagnosis not present

## 2015-08-23 DIAGNOSIS — I10 Essential (primary) hypertension: Secondary | ICD-10-CM | POA: Diagnosis not present

## 2015-08-23 DIAGNOSIS — I4891 Unspecified atrial fibrillation: Secondary | ICD-10-CM | POA: Diagnosis not present

## 2015-08-25 DIAGNOSIS — G894 Chronic pain syndrome: Secondary | ICD-10-CM | POA: Diagnosis not present

## 2015-08-25 DIAGNOSIS — I872 Venous insufficiency (chronic) (peripheral): Secondary | ICD-10-CM | POA: Diagnosis not present

## 2015-08-25 DIAGNOSIS — L97811 Non-pressure chronic ulcer of other part of right lower leg limited to breakdown of skin: Secondary | ICD-10-CM | POA: Diagnosis not present

## 2015-08-25 DIAGNOSIS — I4891 Unspecified atrial fibrillation: Secondary | ICD-10-CM | POA: Diagnosis not present

## 2015-08-25 DIAGNOSIS — L97821 Non-pressure chronic ulcer of other part of left lower leg limited to breakdown of skin: Secondary | ICD-10-CM | POA: Diagnosis not present

## 2015-08-25 DIAGNOSIS — I10 Essential (primary) hypertension: Secondary | ICD-10-CM | POA: Diagnosis not present

## 2015-08-26 LAB — TOXASSURE SELECT,+ANTIDEPR,UR: PDF: 0

## 2015-08-27 ENCOUNTER — Encounter: Payer: Self-pay | Admitting: Physical Medicine & Rehabilitation

## 2015-08-27 DIAGNOSIS — L97821 Non-pressure chronic ulcer of other part of left lower leg limited to breakdown of skin: Secondary | ICD-10-CM | POA: Diagnosis not present

## 2015-08-27 DIAGNOSIS — I872 Venous insufficiency (chronic) (peripheral): Secondary | ICD-10-CM | POA: Diagnosis not present

## 2015-08-27 DIAGNOSIS — I4891 Unspecified atrial fibrillation: Secondary | ICD-10-CM | POA: Diagnosis not present

## 2015-08-27 DIAGNOSIS — G894 Chronic pain syndrome: Secondary | ICD-10-CM | POA: Diagnosis not present

## 2015-08-27 DIAGNOSIS — L97811 Non-pressure chronic ulcer of other part of right lower leg limited to breakdown of skin: Secondary | ICD-10-CM | POA: Diagnosis not present

## 2015-08-27 DIAGNOSIS — I10 Essential (primary) hypertension: Secondary | ICD-10-CM | POA: Diagnosis not present

## 2015-08-27 NOTE — Progress Notes (Signed)
Urine drug screen for this encounter is consistent for no prescribed narcotic medication

## 2015-08-28 ENCOUNTER — Ambulatory Visit: Payer: Medicare Other

## 2015-08-29 DIAGNOSIS — G894 Chronic pain syndrome: Secondary | ICD-10-CM | POA: Diagnosis not present

## 2015-08-29 DIAGNOSIS — Z6841 Body Mass Index (BMI) 40.0 and over, adult: Secondary | ICD-10-CM | POA: Diagnosis not present

## 2015-08-29 DIAGNOSIS — B965 Pseudomonas (aeruginosa) (mallei) (pseudomallei) as the cause of diseases classified elsewhere: Secondary | ICD-10-CM | POA: Diagnosis not present

## 2015-08-29 DIAGNOSIS — I83009 Varicose veins of unspecified lower extremity with ulcer of unspecified site: Secondary | ICD-10-CM | POA: Diagnosis not present

## 2015-08-29 DIAGNOSIS — I4891 Unspecified atrial fibrillation: Secondary | ICD-10-CM | POA: Diagnosis not present

## 2015-08-29 DIAGNOSIS — M549 Dorsalgia, unspecified: Secondary | ICD-10-CM | POA: Diagnosis not present

## 2015-08-29 DIAGNOSIS — I872 Venous insufficiency (chronic) (peripheral): Secondary | ICD-10-CM | POA: Diagnosis not present

## 2015-08-29 DIAGNOSIS — L97811 Non-pressure chronic ulcer of other part of right lower leg limited to breakdown of skin: Secondary | ICD-10-CM | POA: Diagnosis not present

## 2015-08-29 DIAGNOSIS — J209 Acute bronchitis, unspecified: Secondary | ICD-10-CM | POA: Diagnosis not present

## 2015-08-29 DIAGNOSIS — L97821 Non-pressure chronic ulcer of other part of left lower leg limited to breakdown of skin: Secondary | ICD-10-CM | POA: Diagnosis not present

## 2015-08-29 DIAGNOSIS — I10 Essential (primary) hypertension: Secondary | ICD-10-CM | POA: Diagnosis not present

## 2015-09-01 DIAGNOSIS — L97821 Non-pressure chronic ulcer of other part of left lower leg limited to breakdown of skin: Secondary | ICD-10-CM | POA: Diagnosis not present

## 2015-09-01 DIAGNOSIS — I872 Venous insufficiency (chronic) (peripheral): Secondary | ICD-10-CM | POA: Diagnosis not present

## 2015-09-01 DIAGNOSIS — I4891 Unspecified atrial fibrillation: Secondary | ICD-10-CM | POA: Diagnosis not present

## 2015-09-01 DIAGNOSIS — L97811 Non-pressure chronic ulcer of other part of right lower leg limited to breakdown of skin: Secondary | ICD-10-CM | POA: Diagnosis not present

## 2015-09-01 DIAGNOSIS — I10 Essential (primary) hypertension: Secondary | ICD-10-CM | POA: Diagnosis not present

## 2015-09-01 DIAGNOSIS — G894 Chronic pain syndrome: Secondary | ICD-10-CM | POA: Diagnosis not present

## 2015-09-03 DIAGNOSIS — I872 Venous insufficiency (chronic) (peripheral): Secondary | ICD-10-CM | POA: Diagnosis not present

## 2015-09-03 DIAGNOSIS — I10 Essential (primary) hypertension: Secondary | ICD-10-CM | POA: Diagnosis not present

## 2015-09-03 DIAGNOSIS — I4891 Unspecified atrial fibrillation: Secondary | ICD-10-CM | POA: Diagnosis not present

## 2015-09-03 DIAGNOSIS — G894 Chronic pain syndrome: Secondary | ICD-10-CM | POA: Diagnosis not present

## 2015-09-03 DIAGNOSIS — L97821 Non-pressure chronic ulcer of other part of left lower leg limited to breakdown of skin: Secondary | ICD-10-CM | POA: Diagnosis not present

## 2015-09-03 DIAGNOSIS — L97811 Non-pressure chronic ulcer of other part of right lower leg limited to breakdown of skin: Secondary | ICD-10-CM | POA: Diagnosis not present

## 2015-09-05 DIAGNOSIS — G894 Chronic pain syndrome: Secondary | ICD-10-CM | POA: Diagnosis not present

## 2015-09-05 DIAGNOSIS — L97821 Non-pressure chronic ulcer of other part of left lower leg limited to breakdown of skin: Secondary | ICD-10-CM | POA: Diagnosis not present

## 2015-09-05 DIAGNOSIS — L97811 Non-pressure chronic ulcer of other part of right lower leg limited to breakdown of skin: Secondary | ICD-10-CM | POA: Diagnosis not present

## 2015-09-05 DIAGNOSIS — I4891 Unspecified atrial fibrillation: Secondary | ICD-10-CM | POA: Diagnosis not present

## 2015-09-05 DIAGNOSIS — I10 Essential (primary) hypertension: Secondary | ICD-10-CM | POA: Diagnosis not present

## 2015-09-05 DIAGNOSIS — I872 Venous insufficiency (chronic) (peripheral): Secondary | ICD-10-CM | POA: Diagnosis not present

## 2015-09-08 DIAGNOSIS — I10 Essential (primary) hypertension: Secondary | ICD-10-CM | POA: Diagnosis not present

## 2015-09-08 DIAGNOSIS — G894 Chronic pain syndrome: Secondary | ICD-10-CM | POA: Diagnosis not present

## 2015-09-08 DIAGNOSIS — L97821 Non-pressure chronic ulcer of other part of left lower leg limited to breakdown of skin: Secondary | ICD-10-CM | POA: Diagnosis not present

## 2015-09-08 DIAGNOSIS — L97811 Non-pressure chronic ulcer of other part of right lower leg limited to breakdown of skin: Secondary | ICD-10-CM | POA: Diagnosis not present

## 2015-09-08 DIAGNOSIS — I4891 Unspecified atrial fibrillation: Secondary | ICD-10-CM | POA: Diagnosis not present

## 2015-09-08 DIAGNOSIS — I872 Venous insufficiency (chronic) (peripheral): Secondary | ICD-10-CM | POA: Diagnosis not present

## 2015-09-10 DIAGNOSIS — I10 Essential (primary) hypertension: Secondary | ICD-10-CM | POA: Diagnosis not present

## 2015-09-10 DIAGNOSIS — I872 Venous insufficiency (chronic) (peripheral): Secondary | ICD-10-CM | POA: Diagnosis not present

## 2015-09-10 DIAGNOSIS — L97821 Non-pressure chronic ulcer of other part of left lower leg limited to breakdown of skin: Secondary | ICD-10-CM | POA: Diagnosis not present

## 2015-09-10 DIAGNOSIS — L97811 Non-pressure chronic ulcer of other part of right lower leg limited to breakdown of skin: Secondary | ICD-10-CM | POA: Diagnosis not present

## 2015-09-10 DIAGNOSIS — G894 Chronic pain syndrome: Secondary | ICD-10-CM | POA: Diagnosis not present

## 2015-09-10 DIAGNOSIS — I4891 Unspecified atrial fibrillation: Secondary | ICD-10-CM | POA: Diagnosis not present

## 2015-09-11 ENCOUNTER — Telehealth: Payer: Self-pay | Admitting: *Deleted

## 2015-09-11 ENCOUNTER — Other Ambulatory Visit: Payer: Self-pay | Admitting: *Deleted

## 2015-09-11 MED ORDER — AMITRIPTYLINE HCL 150 MG PO TABS: 150.0000 mg | ORAL_TABLET | Freq: Every day | ORAL | Status: DC

## 2015-09-11 NOTE — Telephone Encounter (Signed)
Patient is requesting a 90 day supply of amitriptyline...is it okay to make this adjustment?

## 2015-09-11 NOTE — Telephone Encounter (Signed)
Yes, we can give him a 90 days supply.

## 2015-09-11 NOTE — Telephone Encounter (Signed)
Done, patient notified

## 2015-09-12 DIAGNOSIS — L97811 Non-pressure chronic ulcer of other part of right lower leg limited to breakdown of skin: Secondary | ICD-10-CM | POA: Diagnosis not present

## 2015-09-12 DIAGNOSIS — I10 Essential (primary) hypertension: Secondary | ICD-10-CM | POA: Diagnosis not present

## 2015-09-12 DIAGNOSIS — L97821 Non-pressure chronic ulcer of other part of left lower leg limited to breakdown of skin: Secondary | ICD-10-CM | POA: Diagnosis not present

## 2015-09-12 DIAGNOSIS — I4891 Unspecified atrial fibrillation: Secondary | ICD-10-CM | POA: Diagnosis not present

## 2015-09-12 DIAGNOSIS — G894 Chronic pain syndrome: Secondary | ICD-10-CM | POA: Diagnosis not present

## 2015-09-12 DIAGNOSIS — I872 Venous insufficiency (chronic) (peripheral): Secondary | ICD-10-CM | POA: Diagnosis not present

## 2015-09-13 DIAGNOSIS — I872 Venous insufficiency (chronic) (peripheral): Secondary | ICD-10-CM | POA: Diagnosis not present

## 2015-09-13 DIAGNOSIS — L97821 Non-pressure chronic ulcer of other part of left lower leg limited to breakdown of skin: Secondary | ICD-10-CM | POA: Diagnosis not present

## 2015-09-13 DIAGNOSIS — I10 Essential (primary) hypertension: Secondary | ICD-10-CM | POA: Diagnosis not present

## 2015-09-13 DIAGNOSIS — L97811 Non-pressure chronic ulcer of other part of right lower leg limited to breakdown of skin: Secondary | ICD-10-CM | POA: Diagnosis not present

## 2015-09-13 DIAGNOSIS — G894 Chronic pain syndrome: Secondary | ICD-10-CM | POA: Diagnosis not present

## 2015-09-13 DIAGNOSIS — I4891 Unspecified atrial fibrillation: Secondary | ICD-10-CM | POA: Diagnosis not present

## 2015-09-15 DIAGNOSIS — G894 Chronic pain syndrome: Secondary | ICD-10-CM | POA: Diagnosis not present

## 2015-09-15 DIAGNOSIS — I872 Venous insufficiency (chronic) (peripheral): Secondary | ICD-10-CM | POA: Diagnosis not present

## 2015-09-15 DIAGNOSIS — L97811 Non-pressure chronic ulcer of other part of right lower leg limited to breakdown of skin: Secondary | ICD-10-CM | POA: Diagnosis not present

## 2015-09-15 DIAGNOSIS — I4891 Unspecified atrial fibrillation: Secondary | ICD-10-CM | POA: Diagnosis not present

## 2015-09-15 DIAGNOSIS — L97821 Non-pressure chronic ulcer of other part of left lower leg limited to breakdown of skin: Secondary | ICD-10-CM | POA: Diagnosis not present

## 2015-09-15 DIAGNOSIS — I10 Essential (primary) hypertension: Secondary | ICD-10-CM | POA: Diagnosis not present

## 2015-09-17 DIAGNOSIS — L97811 Non-pressure chronic ulcer of other part of right lower leg limited to breakdown of skin: Secondary | ICD-10-CM | POA: Diagnosis not present

## 2015-09-17 DIAGNOSIS — G894 Chronic pain syndrome: Secondary | ICD-10-CM | POA: Diagnosis not present

## 2015-09-17 DIAGNOSIS — L97821 Non-pressure chronic ulcer of other part of left lower leg limited to breakdown of skin: Secondary | ICD-10-CM | POA: Diagnosis not present

## 2015-09-17 DIAGNOSIS — I10 Essential (primary) hypertension: Secondary | ICD-10-CM | POA: Diagnosis not present

## 2015-09-17 DIAGNOSIS — I4891 Unspecified atrial fibrillation: Secondary | ICD-10-CM | POA: Diagnosis not present

## 2015-09-17 DIAGNOSIS — I872 Venous insufficiency (chronic) (peripheral): Secondary | ICD-10-CM | POA: Diagnosis not present

## 2015-09-19 DIAGNOSIS — I872 Venous insufficiency (chronic) (peripheral): Secondary | ICD-10-CM | POA: Diagnosis not present

## 2015-09-19 DIAGNOSIS — I4891 Unspecified atrial fibrillation: Secondary | ICD-10-CM | POA: Diagnosis not present

## 2015-09-19 DIAGNOSIS — I10 Essential (primary) hypertension: Secondary | ICD-10-CM | POA: Diagnosis not present

## 2015-09-19 DIAGNOSIS — G894 Chronic pain syndrome: Secondary | ICD-10-CM | POA: Diagnosis not present

## 2015-09-19 DIAGNOSIS — L97811 Non-pressure chronic ulcer of other part of right lower leg limited to breakdown of skin: Secondary | ICD-10-CM | POA: Diagnosis not present

## 2015-09-19 DIAGNOSIS — L97821 Non-pressure chronic ulcer of other part of left lower leg limited to breakdown of skin: Secondary | ICD-10-CM | POA: Diagnosis not present

## 2015-09-22 DIAGNOSIS — I4891 Unspecified atrial fibrillation: Secondary | ICD-10-CM | POA: Diagnosis not present

## 2015-09-22 DIAGNOSIS — L97821 Non-pressure chronic ulcer of other part of left lower leg limited to breakdown of skin: Secondary | ICD-10-CM | POA: Diagnosis not present

## 2015-09-22 DIAGNOSIS — L97811 Non-pressure chronic ulcer of other part of right lower leg limited to breakdown of skin: Secondary | ICD-10-CM | POA: Diagnosis not present

## 2015-09-22 DIAGNOSIS — G894 Chronic pain syndrome: Secondary | ICD-10-CM | POA: Diagnosis not present

## 2015-09-22 DIAGNOSIS — I10 Essential (primary) hypertension: Secondary | ICD-10-CM | POA: Diagnosis not present

## 2015-09-22 DIAGNOSIS — I872 Venous insufficiency (chronic) (peripheral): Secondary | ICD-10-CM | POA: Diagnosis not present

## 2015-09-23 DIAGNOSIS — I10 Essential (primary) hypertension: Secondary | ICD-10-CM | POA: Diagnosis not present

## 2015-09-23 DIAGNOSIS — I872 Venous insufficiency (chronic) (peripheral): Secondary | ICD-10-CM | POA: Diagnosis not present

## 2015-09-23 DIAGNOSIS — L97811 Non-pressure chronic ulcer of other part of right lower leg limited to breakdown of skin: Secondary | ICD-10-CM | POA: Diagnosis not present

## 2015-09-23 DIAGNOSIS — G894 Chronic pain syndrome: Secondary | ICD-10-CM | POA: Diagnosis not present

## 2015-09-23 DIAGNOSIS — I4891 Unspecified atrial fibrillation: Secondary | ICD-10-CM | POA: Diagnosis not present

## 2015-09-23 DIAGNOSIS — L97821 Non-pressure chronic ulcer of other part of left lower leg limited to breakdown of skin: Secondary | ICD-10-CM | POA: Diagnosis not present

## 2015-09-24 DIAGNOSIS — L97811 Non-pressure chronic ulcer of other part of right lower leg limited to breakdown of skin: Secondary | ICD-10-CM | POA: Diagnosis not present

## 2015-09-24 DIAGNOSIS — I10 Essential (primary) hypertension: Secondary | ICD-10-CM | POA: Diagnosis not present

## 2015-09-24 DIAGNOSIS — I4891 Unspecified atrial fibrillation: Secondary | ICD-10-CM | POA: Diagnosis not present

## 2015-09-24 DIAGNOSIS — G894 Chronic pain syndrome: Secondary | ICD-10-CM | POA: Diagnosis not present

## 2015-09-24 DIAGNOSIS — L97821 Non-pressure chronic ulcer of other part of left lower leg limited to breakdown of skin: Secondary | ICD-10-CM | POA: Diagnosis not present

## 2015-09-24 DIAGNOSIS — I872 Venous insufficiency (chronic) (peripheral): Secondary | ICD-10-CM | POA: Diagnosis not present

## 2015-09-25 DIAGNOSIS — I4891 Unspecified atrial fibrillation: Secondary | ICD-10-CM | POA: Diagnosis not present

## 2015-09-25 DIAGNOSIS — I872 Venous insufficiency (chronic) (peripheral): Secondary | ICD-10-CM | POA: Diagnosis not present

## 2015-09-25 DIAGNOSIS — L97811 Non-pressure chronic ulcer of other part of right lower leg limited to breakdown of skin: Secondary | ICD-10-CM | POA: Diagnosis not present

## 2015-09-25 DIAGNOSIS — G894 Chronic pain syndrome: Secondary | ICD-10-CM | POA: Diagnosis not present

## 2015-09-25 DIAGNOSIS — L97821 Non-pressure chronic ulcer of other part of left lower leg limited to breakdown of skin: Secondary | ICD-10-CM | POA: Diagnosis not present

## 2015-09-25 DIAGNOSIS — I10 Essential (primary) hypertension: Secondary | ICD-10-CM | POA: Diagnosis not present

## 2015-09-26 DIAGNOSIS — Z4800 Encounter for change or removal of nonsurgical wound dressing: Secondary | ICD-10-CM | POA: Diagnosis not present

## 2015-09-26 DIAGNOSIS — E291 Testicular hypofunction: Secondary | ICD-10-CM | POA: Diagnosis not present

## 2015-09-26 DIAGNOSIS — D649 Anemia, unspecified: Secondary | ICD-10-CM | POA: Diagnosis not present

## 2015-09-26 DIAGNOSIS — I872 Venous insufficiency (chronic) (peripheral): Secondary | ICD-10-CM | POA: Diagnosis not present

## 2015-09-26 DIAGNOSIS — L97811 Non-pressure chronic ulcer of other part of right lower leg limited to breakdown of skin: Secondary | ICD-10-CM | POA: Diagnosis not present

## 2015-09-26 DIAGNOSIS — L97821 Non-pressure chronic ulcer of other part of left lower leg limited to breakdown of skin: Secondary | ICD-10-CM | POA: Diagnosis not present

## 2015-09-27 DIAGNOSIS — Z4800 Encounter for change or removal of nonsurgical wound dressing: Secondary | ICD-10-CM | POA: Diagnosis not present

## 2015-09-27 DIAGNOSIS — D649 Anemia, unspecified: Secondary | ICD-10-CM | POA: Diagnosis not present

## 2015-09-27 DIAGNOSIS — L97811 Non-pressure chronic ulcer of other part of right lower leg limited to breakdown of skin: Secondary | ICD-10-CM | POA: Diagnosis not present

## 2015-09-27 DIAGNOSIS — I872 Venous insufficiency (chronic) (peripheral): Secondary | ICD-10-CM | POA: Diagnosis not present

## 2015-09-27 DIAGNOSIS — E291 Testicular hypofunction: Secondary | ICD-10-CM | POA: Diagnosis not present

## 2015-09-27 DIAGNOSIS — L97821 Non-pressure chronic ulcer of other part of left lower leg limited to breakdown of skin: Secondary | ICD-10-CM | POA: Diagnosis not present

## 2015-09-28 DIAGNOSIS — I872 Venous insufficiency (chronic) (peripheral): Secondary | ICD-10-CM | POA: Diagnosis not present

## 2015-09-28 DIAGNOSIS — E291 Testicular hypofunction: Secondary | ICD-10-CM | POA: Diagnosis not present

## 2015-09-28 DIAGNOSIS — D649 Anemia, unspecified: Secondary | ICD-10-CM | POA: Diagnosis not present

## 2015-09-28 DIAGNOSIS — L97821 Non-pressure chronic ulcer of other part of left lower leg limited to breakdown of skin: Secondary | ICD-10-CM | POA: Diagnosis not present

## 2015-09-28 DIAGNOSIS — Z4800 Encounter for change or removal of nonsurgical wound dressing: Secondary | ICD-10-CM | POA: Diagnosis not present

## 2015-09-28 DIAGNOSIS — L97811 Non-pressure chronic ulcer of other part of right lower leg limited to breakdown of skin: Secondary | ICD-10-CM | POA: Diagnosis not present

## 2015-09-29 DIAGNOSIS — E291 Testicular hypofunction: Secondary | ICD-10-CM | POA: Diagnosis not present

## 2015-09-29 DIAGNOSIS — Z4800 Encounter for change or removal of nonsurgical wound dressing: Secondary | ICD-10-CM | POA: Diagnosis not present

## 2015-09-29 DIAGNOSIS — L97821 Non-pressure chronic ulcer of other part of left lower leg limited to breakdown of skin: Secondary | ICD-10-CM | POA: Diagnosis not present

## 2015-09-29 DIAGNOSIS — I872 Venous insufficiency (chronic) (peripheral): Secondary | ICD-10-CM | POA: Diagnosis not present

## 2015-09-29 DIAGNOSIS — L97811 Non-pressure chronic ulcer of other part of right lower leg limited to breakdown of skin: Secondary | ICD-10-CM | POA: Diagnosis not present

## 2015-09-29 DIAGNOSIS — D649 Anemia, unspecified: Secondary | ICD-10-CM | POA: Diagnosis not present

## 2015-09-30 DIAGNOSIS — D649 Anemia, unspecified: Secondary | ICD-10-CM | POA: Diagnosis not present

## 2015-09-30 DIAGNOSIS — E291 Testicular hypofunction: Secondary | ICD-10-CM | POA: Diagnosis not present

## 2015-09-30 DIAGNOSIS — M549 Dorsalgia, unspecified: Secondary | ICD-10-CM | POA: Diagnosis not present

## 2015-09-30 DIAGNOSIS — I10 Essential (primary) hypertension: Secondary | ICD-10-CM | POA: Diagnosis not present

## 2015-09-30 DIAGNOSIS — M1710 Unilateral primary osteoarthritis, unspecified knee: Secondary | ICD-10-CM | POA: Diagnosis not present

## 2015-09-30 DIAGNOSIS — I83009 Varicose veins of unspecified lower extremity with ulcer of unspecified site: Secondary | ICD-10-CM | POA: Diagnosis not present

## 2015-10-01 DIAGNOSIS — L97821 Non-pressure chronic ulcer of other part of left lower leg limited to breakdown of skin: Secondary | ICD-10-CM | POA: Diagnosis not present

## 2015-10-01 DIAGNOSIS — L97811 Non-pressure chronic ulcer of other part of right lower leg limited to breakdown of skin: Secondary | ICD-10-CM | POA: Diagnosis not present

## 2015-10-01 DIAGNOSIS — I872 Venous insufficiency (chronic) (peripheral): Secondary | ICD-10-CM | POA: Diagnosis not present

## 2015-10-01 DIAGNOSIS — Z4800 Encounter for change or removal of nonsurgical wound dressing: Secondary | ICD-10-CM | POA: Diagnosis not present

## 2015-10-01 DIAGNOSIS — D649 Anemia, unspecified: Secondary | ICD-10-CM | POA: Diagnosis not present

## 2015-10-01 DIAGNOSIS — E291 Testicular hypofunction: Secondary | ICD-10-CM | POA: Diagnosis not present

## 2015-10-02 ENCOUNTER — Encounter: Payer: Medicare Other | Admitting: Physical Medicine & Rehabilitation

## 2015-10-03 DIAGNOSIS — E291 Testicular hypofunction: Secondary | ICD-10-CM | POA: Diagnosis not present

## 2015-10-03 DIAGNOSIS — Z4800 Encounter for change or removal of nonsurgical wound dressing: Secondary | ICD-10-CM | POA: Diagnosis not present

## 2015-10-03 DIAGNOSIS — D649 Anemia, unspecified: Secondary | ICD-10-CM | POA: Diagnosis not present

## 2015-10-03 DIAGNOSIS — L97821 Non-pressure chronic ulcer of other part of left lower leg limited to breakdown of skin: Secondary | ICD-10-CM | POA: Diagnosis not present

## 2015-10-03 DIAGNOSIS — L97811 Non-pressure chronic ulcer of other part of right lower leg limited to breakdown of skin: Secondary | ICD-10-CM | POA: Diagnosis not present

## 2015-10-03 DIAGNOSIS — I872 Venous insufficiency (chronic) (peripheral): Secondary | ICD-10-CM | POA: Diagnosis not present

## 2015-10-06 DIAGNOSIS — I872 Venous insufficiency (chronic) (peripheral): Secondary | ICD-10-CM | POA: Diagnosis not present

## 2015-10-06 DIAGNOSIS — Z4800 Encounter for change or removal of nonsurgical wound dressing: Secondary | ICD-10-CM | POA: Diagnosis not present

## 2015-10-06 DIAGNOSIS — D649 Anemia, unspecified: Secondary | ICD-10-CM | POA: Diagnosis not present

## 2015-10-06 DIAGNOSIS — L97821 Non-pressure chronic ulcer of other part of left lower leg limited to breakdown of skin: Secondary | ICD-10-CM | POA: Diagnosis not present

## 2015-10-06 DIAGNOSIS — L97811 Non-pressure chronic ulcer of other part of right lower leg limited to breakdown of skin: Secondary | ICD-10-CM | POA: Diagnosis not present

## 2015-10-06 DIAGNOSIS — E291 Testicular hypofunction: Secondary | ICD-10-CM | POA: Diagnosis not present

## 2015-10-24 DIAGNOSIS — I509 Heart failure, unspecified: Secondary | ICD-10-CM | POA: Diagnosis not present

## 2015-10-24 DIAGNOSIS — I4891 Unspecified atrial fibrillation: Secondary | ICD-10-CM | POA: Diagnosis not present

## 2015-10-24 DIAGNOSIS — I872 Venous insufficiency (chronic) (peripheral): Secondary | ICD-10-CM | POA: Diagnosis not present

## 2015-10-24 DIAGNOSIS — L97821 Non-pressure chronic ulcer of other part of left lower leg limited to breakdown of skin: Secondary | ICD-10-CM | POA: Diagnosis not present

## 2015-10-24 DIAGNOSIS — I890 Lymphedema, not elsewhere classified: Secondary | ICD-10-CM | POA: Diagnosis not present

## 2015-10-24 DIAGNOSIS — M179 Osteoarthritis of knee, unspecified: Secondary | ICD-10-CM | POA: Diagnosis not present

## 2015-10-24 DIAGNOSIS — I739 Peripheral vascular disease, unspecified: Secondary | ICD-10-CM | POA: Diagnosis not present

## 2015-10-24 DIAGNOSIS — L97811 Non-pressure chronic ulcer of other part of right lower leg limited to breakdown of skin: Secondary | ICD-10-CM | POA: Diagnosis not present

## 2015-10-24 DIAGNOSIS — I110 Hypertensive heart disease with heart failure: Secondary | ICD-10-CM | POA: Diagnosis not present

## 2015-10-30 DIAGNOSIS — I872 Venous insufficiency (chronic) (peripheral): Secondary | ICD-10-CM | POA: Diagnosis not present

## 2015-10-30 DIAGNOSIS — L97811 Non-pressure chronic ulcer of other part of right lower leg limited to breakdown of skin: Secondary | ICD-10-CM | POA: Diagnosis not present

## 2015-10-30 DIAGNOSIS — I890 Lymphedema, not elsewhere classified: Secondary | ICD-10-CM | POA: Diagnosis not present

## 2015-10-30 DIAGNOSIS — L97821 Non-pressure chronic ulcer of other part of left lower leg limited to breakdown of skin: Secondary | ICD-10-CM | POA: Diagnosis not present

## 2015-11-07 DIAGNOSIS — I872 Venous insufficiency (chronic) (peripheral): Secondary | ICD-10-CM | POA: Diagnosis not present

## 2015-11-07 DIAGNOSIS — L97821 Non-pressure chronic ulcer of other part of left lower leg limited to breakdown of skin: Secondary | ICD-10-CM | POA: Diagnosis not present

## 2015-11-07 DIAGNOSIS — L97811 Non-pressure chronic ulcer of other part of right lower leg limited to breakdown of skin: Secondary | ICD-10-CM | POA: Diagnosis not present

## 2015-11-07 DIAGNOSIS — L7622 Postprocedural hemorrhage and hematoma of skin and subcutaneous tissue following other procedure: Secondary | ICD-10-CM | POA: Diagnosis not present

## 2015-11-07 DIAGNOSIS — I890 Lymphedema, not elsewhere classified: Secondary | ICD-10-CM | POA: Diagnosis not present

## 2015-11-14 DIAGNOSIS — I872 Venous insufficiency (chronic) (peripheral): Secondary | ICD-10-CM | POA: Diagnosis not present

## 2015-11-14 DIAGNOSIS — L97821 Non-pressure chronic ulcer of other part of left lower leg limited to breakdown of skin: Secondary | ICD-10-CM | POA: Diagnosis not present

## 2015-11-14 DIAGNOSIS — I890 Lymphedema, not elsewhere classified: Secondary | ICD-10-CM | POA: Diagnosis not present

## 2015-11-14 DIAGNOSIS — L97811 Non-pressure chronic ulcer of other part of right lower leg limited to breakdown of skin: Secondary | ICD-10-CM | POA: Diagnosis not present

## 2015-11-20 DIAGNOSIS — I872 Venous insufficiency (chronic) (peripheral): Secondary | ICD-10-CM | POA: Diagnosis not present

## 2015-11-20 DIAGNOSIS — L97811 Non-pressure chronic ulcer of other part of right lower leg limited to breakdown of skin: Secondary | ICD-10-CM | POA: Diagnosis not present

## 2015-11-20 DIAGNOSIS — L97821 Non-pressure chronic ulcer of other part of left lower leg limited to breakdown of skin: Secondary | ICD-10-CM | POA: Diagnosis not present

## 2015-11-20 DIAGNOSIS — I890 Lymphedema, not elsewhere classified: Secondary | ICD-10-CM | POA: Diagnosis not present

## 2015-11-20 DIAGNOSIS — L97219 Non-pressure chronic ulcer of right calf with unspecified severity: Secondary | ICD-10-CM | POA: Diagnosis not present

## 2015-11-21 DIAGNOSIS — I872 Venous insufficiency (chronic) (peripheral): Secondary | ICD-10-CM | POA: Diagnosis not present

## 2015-11-21 DIAGNOSIS — M7989 Other specified soft tissue disorders: Secondary | ICD-10-CM | POA: Diagnosis not present

## 2015-12-05 DIAGNOSIS — I890 Lymphedema, not elsewhere classified: Secondary | ICD-10-CM | POA: Diagnosis not present

## 2015-12-05 DIAGNOSIS — I872 Venous insufficiency (chronic) (peripheral): Secondary | ICD-10-CM | POA: Diagnosis not present

## 2015-12-05 DIAGNOSIS — L97811 Non-pressure chronic ulcer of other part of right lower leg limited to breakdown of skin: Secondary | ICD-10-CM | POA: Diagnosis not present

## 2015-12-05 DIAGNOSIS — L97821 Non-pressure chronic ulcer of other part of left lower leg limited to breakdown of skin: Secondary | ICD-10-CM | POA: Diagnosis not present

## 2015-12-09 DIAGNOSIS — I890 Lymphedema, not elsewhere classified: Secondary | ICD-10-CM | POA: Diagnosis not present

## 2015-12-09 DIAGNOSIS — I872 Venous insufficiency (chronic) (peripheral): Secondary | ICD-10-CM | POA: Diagnosis not present

## 2015-12-09 DIAGNOSIS — L97821 Non-pressure chronic ulcer of other part of left lower leg limited to breakdown of skin: Secondary | ICD-10-CM | POA: Diagnosis not present

## 2015-12-09 DIAGNOSIS — L97811 Non-pressure chronic ulcer of other part of right lower leg limited to breakdown of skin: Secondary | ICD-10-CM | POA: Diagnosis not present

## 2015-12-17 DIAGNOSIS — I872 Venous insufficiency (chronic) (peripheral): Secondary | ICD-10-CM | POA: Diagnosis not present

## 2015-12-17 DIAGNOSIS — I87313 Chronic venous hypertension (idiopathic) with ulcer of bilateral lower extremity: Secondary | ICD-10-CM | POA: Diagnosis not present

## 2015-12-17 DIAGNOSIS — L97811 Non-pressure chronic ulcer of other part of right lower leg limited to breakdown of skin: Secondary | ICD-10-CM | POA: Diagnosis not present

## 2015-12-17 DIAGNOSIS — L97821 Non-pressure chronic ulcer of other part of left lower leg limited to breakdown of skin: Secondary | ICD-10-CM | POA: Diagnosis not present

## 2015-12-17 DIAGNOSIS — I890 Lymphedema, not elsewhere classified: Secondary | ICD-10-CM | POA: Diagnosis not present

## 2015-12-26 DIAGNOSIS — I872 Venous insufficiency (chronic) (peripheral): Secondary | ICD-10-CM | POA: Diagnosis not present

## 2015-12-26 DIAGNOSIS — L97821 Non-pressure chronic ulcer of other part of left lower leg limited to breakdown of skin: Secondary | ICD-10-CM | POA: Diagnosis not present

## 2015-12-26 DIAGNOSIS — I87313 Chronic venous hypertension (idiopathic) with ulcer of bilateral lower extremity: Secondary | ICD-10-CM | POA: Diagnosis not present

## 2015-12-26 DIAGNOSIS — L97811 Non-pressure chronic ulcer of other part of right lower leg limited to breakdown of skin: Secondary | ICD-10-CM | POA: Diagnosis not present

## 2015-12-26 DIAGNOSIS — I890 Lymphedema, not elsewhere classified: Secondary | ICD-10-CM | POA: Diagnosis not present

## 2016-01-02 DIAGNOSIS — I872 Venous insufficiency (chronic) (peripheral): Secondary | ICD-10-CM | POA: Diagnosis not present

## 2016-01-02 DIAGNOSIS — R2243 Localized swelling, mass and lump, lower limb, bilateral: Secondary | ICD-10-CM | POA: Diagnosis not present

## 2016-01-02 DIAGNOSIS — I890 Lymphedema, not elsewhere classified: Secondary | ICD-10-CM | POA: Diagnosis not present

## 2016-01-02 DIAGNOSIS — L97821 Non-pressure chronic ulcer of other part of left lower leg limited to breakdown of skin: Secondary | ICD-10-CM | POA: Diagnosis not present

## 2016-01-02 DIAGNOSIS — I87313 Chronic venous hypertension (idiopathic) with ulcer of bilateral lower extremity: Secondary | ICD-10-CM | POA: Diagnosis not present

## 2016-01-02 DIAGNOSIS — L97811 Non-pressure chronic ulcer of other part of right lower leg limited to breakdown of skin: Secondary | ICD-10-CM | POA: Diagnosis not present

## 2016-01-13 DIAGNOSIS — I87333 Chronic venous hypertension (idiopathic) with ulcer and inflammation of bilateral lower extremity: Secondary | ICD-10-CM | POA: Diagnosis not present

## 2016-01-13 DIAGNOSIS — L97821 Non-pressure chronic ulcer of other part of left lower leg limited to breakdown of skin: Secondary | ICD-10-CM | POA: Diagnosis not present

## 2016-01-13 DIAGNOSIS — L97811 Non-pressure chronic ulcer of other part of right lower leg limited to breakdown of skin: Secondary | ICD-10-CM | POA: Diagnosis not present

## 2016-01-13 DIAGNOSIS — I872 Venous insufficiency (chronic) (peripheral): Secondary | ICD-10-CM | POA: Diagnosis not present

## 2016-01-26 DIAGNOSIS — L97821 Non-pressure chronic ulcer of other part of left lower leg limited to breakdown of skin: Secondary | ICD-10-CM | POA: Diagnosis not present

## 2016-01-26 DIAGNOSIS — I872 Venous insufficiency (chronic) (peripheral): Secondary | ICD-10-CM | POA: Diagnosis not present

## 2016-01-26 DIAGNOSIS — I890 Lymphedema, not elsewhere classified: Secondary | ICD-10-CM | POA: Diagnosis not present

## 2016-01-26 DIAGNOSIS — I87313 Chronic venous hypertension (idiopathic) with ulcer of bilateral lower extremity: Secondary | ICD-10-CM | POA: Diagnosis not present

## 2016-01-26 DIAGNOSIS — L97811 Non-pressure chronic ulcer of other part of right lower leg limited to breakdown of skin: Secondary | ICD-10-CM | POA: Diagnosis not present

## 2016-02-02 DIAGNOSIS — L97811 Non-pressure chronic ulcer of other part of right lower leg limited to breakdown of skin: Secondary | ICD-10-CM | POA: Diagnosis not present

## 2016-02-02 DIAGNOSIS — L97222 Non-pressure chronic ulcer of left calf with fat layer exposed: Secondary | ICD-10-CM | POA: Diagnosis not present

## 2016-02-02 DIAGNOSIS — I87313 Chronic venous hypertension (idiopathic) with ulcer of bilateral lower extremity: Secondary | ICD-10-CM | POA: Diagnosis not present

## 2016-02-02 DIAGNOSIS — I872 Venous insufficiency (chronic) (peripheral): Secondary | ICD-10-CM | POA: Diagnosis not present

## 2016-02-02 DIAGNOSIS — L97212 Non-pressure chronic ulcer of right calf with fat layer exposed: Secondary | ICD-10-CM | POA: Diagnosis not present

## 2016-02-02 DIAGNOSIS — L97821 Non-pressure chronic ulcer of other part of left lower leg limited to breakdown of skin: Secondary | ICD-10-CM | POA: Diagnosis not present

## 2016-02-09 DIAGNOSIS — L97811 Non-pressure chronic ulcer of other part of right lower leg limited to breakdown of skin: Secondary | ICD-10-CM | POA: Diagnosis not present

## 2016-02-09 DIAGNOSIS — I872 Venous insufficiency (chronic) (peripheral): Secondary | ICD-10-CM | POA: Diagnosis not present

## 2016-02-09 DIAGNOSIS — L97821 Non-pressure chronic ulcer of other part of left lower leg limited to breakdown of skin: Secondary | ICD-10-CM | POA: Diagnosis not present

## 2016-02-09 DIAGNOSIS — I87313 Chronic venous hypertension (idiopathic) with ulcer of bilateral lower extremity: Secondary | ICD-10-CM | POA: Diagnosis not present

## 2016-02-09 DIAGNOSIS — I890 Lymphedema, not elsewhere classified: Secondary | ICD-10-CM | POA: Diagnosis not present

## 2016-02-16 DIAGNOSIS — L97811 Non-pressure chronic ulcer of other part of right lower leg limited to breakdown of skin: Secondary | ICD-10-CM | POA: Diagnosis not present

## 2016-02-16 DIAGNOSIS — I87313 Chronic venous hypertension (idiopathic) with ulcer of bilateral lower extremity: Secondary | ICD-10-CM | POA: Diagnosis not present

## 2016-02-16 DIAGNOSIS — L97821 Non-pressure chronic ulcer of other part of left lower leg limited to breakdown of skin: Secondary | ICD-10-CM | POA: Diagnosis not present

## 2016-02-16 DIAGNOSIS — I872 Venous insufficiency (chronic) (peripheral): Secondary | ICD-10-CM | POA: Diagnosis not present

## 2016-02-25 DIAGNOSIS — Z125 Encounter for screening for malignant neoplasm of prostate: Secondary | ICD-10-CM | POA: Diagnosis not present

## 2016-02-25 DIAGNOSIS — L409 Psoriasis, unspecified: Secondary | ICD-10-CM | POA: Diagnosis not present

## 2016-02-25 DIAGNOSIS — I83009 Varicose veins of unspecified lower extremity with ulcer of unspecified site: Secondary | ICD-10-CM | POA: Diagnosis not present

## 2016-02-25 DIAGNOSIS — I4891 Unspecified atrial fibrillation: Secondary | ICD-10-CM | POA: Diagnosis not present

## 2016-02-25 DIAGNOSIS — E291 Testicular hypofunction: Secondary | ICD-10-CM | POA: Diagnosis not present

## 2016-02-25 DIAGNOSIS — I10 Essential (primary) hypertension: Secondary | ICD-10-CM | POA: Diagnosis not present

## 2016-02-26 DIAGNOSIS — I890 Lymphedema, not elsewhere classified: Secondary | ICD-10-CM | POA: Diagnosis not present

## 2016-02-26 DIAGNOSIS — I872 Venous insufficiency (chronic) (peripheral): Secondary | ICD-10-CM | POA: Diagnosis not present

## 2016-02-26 DIAGNOSIS — I87313 Chronic venous hypertension (idiopathic) with ulcer of bilateral lower extremity: Secondary | ICD-10-CM | POA: Diagnosis not present

## 2016-02-26 DIAGNOSIS — L97821 Non-pressure chronic ulcer of other part of left lower leg limited to breakdown of skin: Secondary | ICD-10-CM | POA: Diagnosis not present

## 2016-02-26 DIAGNOSIS — L97811 Non-pressure chronic ulcer of other part of right lower leg limited to breakdown of skin: Secondary | ICD-10-CM | POA: Diagnosis not present

## 2016-03-05 DIAGNOSIS — L97811 Non-pressure chronic ulcer of other part of right lower leg limited to breakdown of skin: Secondary | ICD-10-CM | POA: Diagnosis not present

## 2016-03-05 DIAGNOSIS — I890 Lymphedema, not elsewhere classified: Secondary | ICD-10-CM | POA: Diagnosis not present

## 2016-03-05 DIAGNOSIS — I87313 Chronic venous hypertension (idiopathic) with ulcer of bilateral lower extremity: Secondary | ICD-10-CM | POA: Diagnosis not present

## 2016-03-05 DIAGNOSIS — I872 Venous insufficiency (chronic) (peripheral): Secondary | ICD-10-CM | POA: Diagnosis not present

## 2016-03-05 DIAGNOSIS — L97821 Non-pressure chronic ulcer of other part of left lower leg limited to breakdown of skin: Secondary | ICD-10-CM | POA: Diagnosis not present

## 2016-03-12 DIAGNOSIS — L97811 Non-pressure chronic ulcer of other part of right lower leg limited to breakdown of skin: Secondary | ICD-10-CM | POA: Diagnosis not present

## 2016-03-12 DIAGNOSIS — I890 Lymphedema, not elsewhere classified: Secondary | ICD-10-CM | POA: Diagnosis not present

## 2016-03-12 DIAGNOSIS — L97821 Non-pressure chronic ulcer of other part of left lower leg limited to breakdown of skin: Secondary | ICD-10-CM | POA: Diagnosis not present

## 2016-03-12 DIAGNOSIS — I872 Venous insufficiency (chronic) (peripheral): Secondary | ICD-10-CM | POA: Diagnosis not present

## 2016-03-12 DIAGNOSIS — I87313 Chronic venous hypertension (idiopathic) with ulcer of bilateral lower extremity: Secondary | ICD-10-CM | POA: Diagnosis not present

## 2016-03-19 DIAGNOSIS — L97821 Non-pressure chronic ulcer of other part of left lower leg limited to breakdown of skin: Secondary | ICD-10-CM | POA: Diagnosis not present

## 2016-03-19 DIAGNOSIS — L97212 Non-pressure chronic ulcer of right calf with fat layer exposed: Secondary | ICD-10-CM | POA: Diagnosis not present

## 2016-03-19 DIAGNOSIS — I87313 Chronic venous hypertension (idiopathic) with ulcer of bilateral lower extremity: Secondary | ICD-10-CM | POA: Diagnosis not present

## 2016-03-19 DIAGNOSIS — I872 Venous insufficiency (chronic) (peripheral): Secondary | ICD-10-CM | POA: Diagnosis not present

## 2016-03-19 DIAGNOSIS — L97222 Non-pressure chronic ulcer of left calf with fat layer exposed: Secondary | ICD-10-CM | POA: Diagnosis not present

## 2016-03-19 DIAGNOSIS — L97811 Non-pressure chronic ulcer of other part of right lower leg limited to breakdown of skin: Secondary | ICD-10-CM | POA: Diagnosis not present

## 2016-03-19 DIAGNOSIS — I890 Lymphedema, not elsewhere classified: Secondary | ICD-10-CM | POA: Diagnosis not present

## 2016-03-26 DIAGNOSIS — L97822 Non-pressure chronic ulcer of other part of left lower leg with fat layer exposed: Secondary | ICD-10-CM | POA: Diagnosis not present

## 2016-03-26 DIAGNOSIS — I890 Lymphedema, not elsewhere classified: Secondary | ICD-10-CM | POA: Diagnosis not present

## 2016-03-26 DIAGNOSIS — I87333 Chronic venous hypertension (idiopathic) with ulcer and inflammation of bilateral lower extremity: Secondary | ICD-10-CM | POA: Diagnosis not present

## 2016-03-26 DIAGNOSIS — I872 Venous insufficiency (chronic) (peripheral): Secondary | ICD-10-CM | POA: Diagnosis not present

## 2016-03-26 DIAGNOSIS — L97812 Non-pressure chronic ulcer of other part of right lower leg with fat layer exposed: Secondary | ICD-10-CM | POA: Diagnosis not present

## 2016-04-02 DIAGNOSIS — I890 Lymphedema, not elsewhere classified: Secondary | ICD-10-CM | POA: Diagnosis not present

## 2016-04-02 DIAGNOSIS — L97222 Non-pressure chronic ulcer of left calf with fat layer exposed: Secondary | ICD-10-CM | POA: Diagnosis not present

## 2016-04-02 DIAGNOSIS — L97822 Non-pressure chronic ulcer of other part of left lower leg with fat layer exposed: Secondary | ICD-10-CM | POA: Diagnosis not present

## 2016-04-02 DIAGNOSIS — L97812 Non-pressure chronic ulcer of other part of right lower leg with fat layer exposed: Secondary | ICD-10-CM | POA: Diagnosis not present

## 2016-04-02 DIAGNOSIS — I872 Venous insufficiency (chronic) (peripheral): Secondary | ICD-10-CM | POA: Diagnosis not present

## 2016-04-07 DIAGNOSIS — E291 Testicular hypofunction: Secondary | ICD-10-CM | POA: Diagnosis not present

## 2016-04-07 DIAGNOSIS — D509 Iron deficiency anemia, unspecified: Secondary | ICD-10-CM | POA: Diagnosis not present

## 2016-04-08 DIAGNOSIS — I890 Lymphedema, not elsewhere classified: Secondary | ICD-10-CM | POA: Diagnosis not present

## 2016-04-08 DIAGNOSIS — I87313 Chronic venous hypertension (idiopathic) with ulcer of bilateral lower extremity: Secondary | ICD-10-CM | POA: Diagnosis not present

## 2016-04-08 DIAGNOSIS — L97222 Non-pressure chronic ulcer of left calf with fat layer exposed: Secondary | ICD-10-CM | POA: Diagnosis not present

## 2016-04-08 DIAGNOSIS — I872 Venous insufficiency (chronic) (peripheral): Secondary | ICD-10-CM | POA: Diagnosis not present

## 2016-04-08 DIAGNOSIS — L97812 Non-pressure chronic ulcer of other part of right lower leg with fat layer exposed: Secondary | ICD-10-CM | POA: Diagnosis not present

## 2016-04-08 DIAGNOSIS — L97822 Non-pressure chronic ulcer of other part of left lower leg with fat layer exposed: Secondary | ICD-10-CM | POA: Diagnosis not present

## 2016-04-08 DIAGNOSIS — L97212 Non-pressure chronic ulcer of right calf with fat layer exposed: Secondary | ICD-10-CM | POA: Diagnosis not present

## 2016-04-23 DIAGNOSIS — L97812 Non-pressure chronic ulcer of other part of right lower leg with fat layer exposed: Secondary | ICD-10-CM | POA: Diagnosis not present

## 2016-04-23 DIAGNOSIS — I890 Lymphedema, not elsewhere classified: Secondary | ICD-10-CM | POA: Diagnosis not present

## 2016-04-23 DIAGNOSIS — I872 Venous insufficiency (chronic) (peripheral): Secondary | ICD-10-CM | POA: Diagnosis not present

## 2016-04-23 DIAGNOSIS — L97212 Non-pressure chronic ulcer of right calf with fat layer exposed: Secondary | ICD-10-CM | POA: Diagnosis not present

## 2016-04-23 DIAGNOSIS — L97222 Non-pressure chronic ulcer of left calf with fat layer exposed: Secondary | ICD-10-CM | POA: Diagnosis not present

## 2016-04-23 DIAGNOSIS — L97822 Non-pressure chronic ulcer of other part of left lower leg with fat layer exposed: Secondary | ICD-10-CM | POA: Diagnosis not present

## 2016-04-23 DIAGNOSIS — I87311 Chronic venous hypertension (idiopathic) with ulcer of right lower extremity: Secondary | ICD-10-CM | POA: Diagnosis not present

## 2016-05-05 DIAGNOSIS — J019 Acute sinusitis, unspecified: Secondary | ICD-10-CM | POA: Diagnosis not present

## 2016-05-05 DIAGNOSIS — I4891 Unspecified atrial fibrillation: Secondary | ICD-10-CM | POA: Diagnosis not present

## 2016-05-05 DIAGNOSIS — Z6841 Body Mass Index (BMI) 40.0 and over, adult: Secondary | ICD-10-CM | POA: Diagnosis not present

## 2016-05-05 DIAGNOSIS — L409 Psoriasis, unspecified: Secondary | ICD-10-CM | POA: Diagnosis not present

## 2016-05-07 DIAGNOSIS — I87313 Chronic venous hypertension (idiopathic) with ulcer of bilateral lower extremity: Secondary | ICD-10-CM | POA: Diagnosis not present

## 2016-05-07 DIAGNOSIS — I890 Lymphedema, not elsewhere classified: Secondary | ICD-10-CM | POA: Diagnosis not present

## 2016-05-07 DIAGNOSIS — L97821 Non-pressure chronic ulcer of other part of left lower leg limited to breakdown of skin: Secondary | ICD-10-CM | POA: Diagnosis not present

## 2016-05-07 DIAGNOSIS — I872 Venous insufficiency (chronic) (peripheral): Secondary | ICD-10-CM | POA: Diagnosis not present

## 2016-05-07 DIAGNOSIS — L97212 Non-pressure chronic ulcer of right calf with fat layer exposed: Secondary | ICD-10-CM | POA: Diagnosis not present

## 2016-05-07 DIAGNOSIS — L97811 Non-pressure chronic ulcer of other part of right lower leg limited to breakdown of skin: Secondary | ICD-10-CM | POA: Diagnosis not present

## 2016-05-07 DIAGNOSIS — L97222 Non-pressure chronic ulcer of left calf with fat layer exposed: Secondary | ICD-10-CM | POA: Diagnosis not present

## 2016-05-21 DIAGNOSIS — L97222 Non-pressure chronic ulcer of left calf with fat layer exposed: Secondary | ICD-10-CM | POA: Diagnosis not present

## 2016-05-21 DIAGNOSIS — I87313 Chronic venous hypertension (idiopathic) with ulcer of bilateral lower extremity: Secondary | ICD-10-CM | POA: Diagnosis not present

## 2016-05-21 DIAGNOSIS — I872 Venous insufficiency (chronic) (peripheral): Secondary | ICD-10-CM | POA: Diagnosis not present

## 2016-05-21 DIAGNOSIS — I890 Lymphedema, not elsewhere classified: Secondary | ICD-10-CM | POA: Diagnosis not present

## 2016-05-21 DIAGNOSIS — L97822 Non-pressure chronic ulcer of other part of left lower leg with fat layer exposed: Secondary | ICD-10-CM | POA: Diagnosis not present

## 2016-05-21 DIAGNOSIS — L97212 Non-pressure chronic ulcer of right calf with fat layer exposed: Secondary | ICD-10-CM | POA: Diagnosis not present

## 2016-05-21 DIAGNOSIS — L97812 Non-pressure chronic ulcer of other part of right lower leg with fat layer exposed: Secondary | ICD-10-CM | POA: Diagnosis not present

## 2016-06-04 DIAGNOSIS — L97222 Non-pressure chronic ulcer of left calf with fat layer exposed: Secondary | ICD-10-CM | POA: Diagnosis not present

## 2016-06-04 DIAGNOSIS — I872 Venous insufficiency (chronic) (peripheral): Secondary | ICD-10-CM | POA: Diagnosis not present

## 2016-06-04 DIAGNOSIS — L97821 Non-pressure chronic ulcer of other part of left lower leg limited to breakdown of skin: Secondary | ICD-10-CM | POA: Diagnosis not present

## 2016-06-04 DIAGNOSIS — I87313 Chronic venous hypertension (idiopathic) with ulcer of bilateral lower extremity: Secondary | ICD-10-CM | POA: Diagnosis not present

## 2016-06-04 DIAGNOSIS — L97212 Non-pressure chronic ulcer of right calf with fat layer exposed: Secondary | ICD-10-CM | POA: Diagnosis not present

## 2016-06-04 DIAGNOSIS — L97811 Non-pressure chronic ulcer of other part of right lower leg limited to breakdown of skin: Secondary | ICD-10-CM | POA: Diagnosis not present

## 2016-06-04 DIAGNOSIS — I890 Lymphedema, not elsewhere classified: Secondary | ICD-10-CM | POA: Diagnosis not present

## 2016-06-09 DIAGNOSIS — I890 Lymphedema, not elsewhere classified: Secondary | ICD-10-CM | POA: Diagnosis not present

## 2016-06-09 DIAGNOSIS — I4891 Unspecified atrial fibrillation: Secondary | ICD-10-CM | POA: Diagnosis not present

## 2016-06-09 DIAGNOSIS — L97822 Non-pressure chronic ulcer of other part of left lower leg with fat layer exposed: Secondary | ICD-10-CM | POA: Diagnosis not present

## 2016-06-09 DIAGNOSIS — L97812 Non-pressure chronic ulcer of other part of right lower leg with fat layer exposed: Secondary | ICD-10-CM | POA: Diagnosis not present

## 2016-06-09 DIAGNOSIS — L97212 Non-pressure chronic ulcer of right calf with fat layer exposed: Secondary | ICD-10-CM | POA: Diagnosis not present

## 2016-06-09 DIAGNOSIS — I739 Peripheral vascular disease, unspecified: Secondary | ICD-10-CM | POA: Diagnosis not present

## 2016-06-09 DIAGNOSIS — I872 Venous insufficiency (chronic) (peripheral): Secondary | ICD-10-CM | POA: Diagnosis not present

## 2016-06-09 DIAGNOSIS — M545 Low back pain: Secondary | ICD-10-CM | POA: Diagnosis not present

## 2016-06-09 DIAGNOSIS — Z4800 Encounter for change or removal of nonsurgical wound dressing: Secondary | ICD-10-CM | POA: Diagnosis not present

## 2016-06-09 DIAGNOSIS — L97222 Non-pressure chronic ulcer of left calf with fat layer exposed: Secondary | ICD-10-CM | POA: Diagnosis not present

## 2016-06-09 DIAGNOSIS — I509 Heart failure, unspecified: Secondary | ICD-10-CM | POA: Diagnosis not present

## 2016-06-09 DIAGNOSIS — Z7982 Long term (current) use of aspirin: Secondary | ICD-10-CM | POA: Diagnosis not present

## 2016-06-09 DIAGNOSIS — J45909 Unspecified asthma, uncomplicated: Secondary | ICD-10-CM | POA: Diagnosis not present

## 2016-06-09 DIAGNOSIS — I110 Hypertensive heart disease with heart failure: Secondary | ICD-10-CM | POA: Diagnosis not present

## 2016-06-09 DIAGNOSIS — G894 Chronic pain syndrome: Secondary | ICD-10-CM | POA: Diagnosis not present

## 2016-06-11 DIAGNOSIS — I739 Peripheral vascular disease, unspecified: Secondary | ICD-10-CM | POA: Diagnosis not present

## 2016-06-11 DIAGNOSIS — L97212 Non-pressure chronic ulcer of right calf with fat layer exposed: Secondary | ICD-10-CM | POA: Diagnosis not present

## 2016-06-11 DIAGNOSIS — L97822 Non-pressure chronic ulcer of other part of left lower leg with fat layer exposed: Secondary | ICD-10-CM | POA: Diagnosis not present

## 2016-06-11 DIAGNOSIS — L97812 Non-pressure chronic ulcer of other part of right lower leg with fat layer exposed: Secondary | ICD-10-CM | POA: Diagnosis not present

## 2016-06-11 DIAGNOSIS — I872 Venous insufficiency (chronic) (peripheral): Secondary | ICD-10-CM | POA: Diagnosis not present

## 2016-06-11 DIAGNOSIS — L97222 Non-pressure chronic ulcer of left calf with fat layer exposed: Secondary | ICD-10-CM | POA: Diagnosis not present

## 2016-06-14 DIAGNOSIS — L97222 Non-pressure chronic ulcer of left calf with fat layer exposed: Secondary | ICD-10-CM | POA: Diagnosis not present

## 2016-06-14 DIAGNOSIS — I872 Venous insufficiency (chronic) (peripheral): Secondary | ICD-10-CM | POA: Diagnosis not present

## 2016-06-14 DIAGNOSIS — I739 Peripheral vascular disease, unspecified: Secondary | ICD-10-CM | POA: Diagnosis not present

## 2016-06-14 DIAGNOSIS — L97822 Non-pressure chronic ulcer of other part of left lower leg with fat layer exposed: Secondary | ICD-10-CM | POA: Diagnosis not present

## 2016-06-14 DIAGNOSIS — L97212 Non-pressure chronic ulcer of right calf with fat layer exposed: Secondary | ICD-10-CM | POA: Diagnosis not present

## 2016-06-14 DIAGNOSIS — L97812 Non-pressure chronic ulcer of other part of right lower leg with fat layer exposed: Secondary | ICD-10-CM | POA: Diagnosis not present

## 2016-06-16 DIAGNOSIS — L97212 Non-pressure chronic ulcer of right calf with fat layer exposed: Secondary | ICD-10-CM | POA: Diagnosis not present

## 2016-06-16 DIAGNOSIS — L97222 Non-pressure chronic ulcer of left calf with fat layer exposed: Secondary | ICD-10-CM | POA: Diagnosis not present

## 2016-06-16 DIAGNOSIS — L97822 Non-pressure chronic ulcer of other part of left lower leg with fat layer exposed: Secondary | ICD-10-CM | POA: Diagnosis not present

## 2016-06-16 DIAGNOSIS — I739 Peripheral vascular disease, unspecified: Secondary | ICD-10-CM | POA: Diagnosis not present

## 2016-06-16 DIAGNOSIS — L97812 Non-pressure chronic ulcer of other part of right lower leg with fat layer exposed: Secondary | ICD-10-CM | POA: Diagnosis not present

## 2016-06-16 DIAGNOSIS — I872 Venous insufficiency (chronic) (peripheral): Secondary | ICD-10-CM | POA: Diagnosis not present

## 2016-06-18 DIAGNOSIS — L97212 Non-pressure chronic ulcer of right calf with fat layer exposed: Secondary | ICD-10-CM | POA: Diagnosis not present

## 2016-06-18 DIAGNOSIS — M25561 Pain in right knee: Secondary | ICD-10-CM | POA: Diagnosis not present

## 2016-06-18 DIAGNOSIS — M179 Osteoarthritis of knee, unspecified: Secondary | ICD-10-CM | POA: Diagnosis not present

## 2016-06-18 DIAGNOSIS — M170 Bilateral primary osteoarthritis of knee: Secondary | ICD-10-CM | POA: Diagnosis not present

## 2016-06-18 DIAGNOSIS — L97222 Non-pressure chronic ulcer of left calf with fat layer exposed: Secondary | ICD-10-CM | POA: Diagnosis not present

## 2016-06-18 DIAGNOSIS — I872 Venous insufficiency (chronic) (peripheral): Secondary | ICD-10-CM | POA: Diagnosis not present

## 2016-06-21 DIAGNOSIS — L97812 Non-pressure chronic ulcer of other part of right lower leg with fat layer exposed: Secondary | ICD-10-CM | POA: Diagnosis not present

## 2016-06-21 DIAGNOSIS — I739 Peripheral vascular disease, unspecified: Secondary | ICD-10-CM | POA: Diagnosis not present

## 2016-06-21 DIAGNOSIS — L97822 Non-pressure chronic ulcer of other part of left lower leg with fat layer exposed: Secondary | ICD-10-CM | POA: Diagnosis not present

## 2016-06-21 DIAGNOSIS — I872 Venous insufficiency (chronic) (peripheral): Secondary | ICD-10-CM | POA: Diagnosis not present

## 2016-06-21 DIAGNOSIS — L97222 Non-pressure chronic ulcer of left calf with fat layer exposed: Secondary | ICD-10-CM | POA: Diagnosis not present

## 2016-06-21 DIAGNOSIS — L97212 Non-pressure chronic ulcer of right calf with fat layer exposed: Secondary | ICD-10-CM | POA: Diagnosis not present

## 2016-06-23 DIAGNOSIS — I872 Venous insufficiency (chronic) (peripheral): Secondary | ICD-10-CM | POA: Diagnosis not present

## 2016-06-23 DIAGNOSIS — L97812 Non-pressure chronic ulcer of other part of right lower leg with fat layer exposed: Secondary | ICD-10-CM | POA: Diagnosis not present

## 2016-06-23 DIAGNOSIS — L97222 Non-pressure chronic ulcer of left calf with fat layer exposed: Secondary | ICD-10-CM | POA: Diagnosis not present

## 2016-06-23 DIAGNOSIS — L97822 Non-pressure chronic ulcer of other part of left lower leg with fat layer exposed: Secondary | ICD-10-CM | POA: Diagnosis not present

## 2016-06-23 DIAGNOSIS — I739 Peripheral vascular disease, unspecified: Secondary | ICD-10-CM | POA: Diagnosis not present

## 2016-06-23 DIAGNOSIS — L97212 Non-pressure chronic ulcer of right calf with fat layer exposed: Secondary | ICD-10-CM | POA: Diagnosis not present

## 2016-06-25 DIAGNOSIS — L97812 Non-pressure chronic ulcer of other part of right lower leg with fat layer exposed: Secondary | ICD-10-CM | POA: Diagnosis not present

## 2016-06-25 DIAGNOSIS — L97822 Non-pressure chronic ulcer of other part of left lower leg with fat layer exposed: Secondary | ICD-10-CM | POA: Diagnosis not present

## 2016-06-25 DIAGNOSIS — I739 Peripheral vascular disease, unspecified: Secondary | ICD-10-CM | POA: Diagnosis not present

## 2016-06-25 DIAGNOSIS — L97222 Non-pressure chronic ulcer of left calf with fat layer exposed: Secondary | ICD-10-CM | POA: Diagnosis not present

## 2016-06-25 DIAGNOSIS — L97212 Non-pressure chronic ulcer of right calf with fat layer exposed: Secondary | ICD-10-CM | POA: Diagnosis not present

## 2016-06-25 DIAGNOSIS — I872 Venous insufficiency (chronic) (peripheral): Secondary | ICD-10-CM | POA: Diagnosis not present

## 2016-06-28 DIAGNOSIS — I872 Venous insufficiency (chronic) (peripheral): Secondary | ICD-10-CM | POA: Diagnosis not present

## 2016-06-28 DIAGNOSIS — L97222 Non-pressure chronic ulcer of left calf with fat layer exposed: Secondary | ICD-10-CM | POA: Diagnosis not present

## 2016-06-28 DIAGNOSIS — L97212 Non-pressure chronic ulcer of right calf with fat layer exposed: Secondary | ICD-10-CM | POA: Diagnosis not present

## 2016-06-28 DIAGNOSIS — L97812 Non-pressure chronic ulcer of other part of right lower leg with fat layer exposed: Secondary | ICD-10-CM | POA: Diagnosis not present

## 2016-06-28 DIAGNOSIS — L97822 Non-pressure chronic ulcer of other part of left lower leg with fat layer exposed: Secondary | ICD-10-CM | POA: Diagnosis not present

## 2016-06-28 DIAGNOSIS — I739 Peripheral vascular disease, unspecified: Secondary | ICD-10-CM | POA: Diagnosis not present

## 2016-06-30 DIAGNOSIS — L97812 Non-pressure chronic ulcer of other part of right lower leg with fat layer exposed: Secondary | ICD-10-CM | POA: Diagnosis not present

## 2016-06-30 DIAGNOSIS — L97822 Non-pressure chronic ulcer of other part of left lower leg with fat layer exposed: Secondary | ICD-10-CM | POA: Diagnosis not present

## 2016-06-30 DIAGNOSIS — I739 Peripheral vascular disease, unspecified: Secondary | ICD-10-CM | POA: Diagnosis not present

## 2016-06-30 DIAGNOSIS — I872 Venous insufficiency (chronic) (peripheral): Secondary | ICD-10-CM | POA: Diagnosis not present

## 2016-06-30 DIAGNOSIS — L97222 Non-pressure chronic ulcer of left calf with fat layer exposed: Secondary | ICD-10-CM | POA: Diagnosis not present

## 2016-06-30 DIAGNOSIS — L97212 Non-pressure chronic ulcer of right calf with fat layer exposed: Secondary | ICD-10-CM | POA: Diagnosis not present

## 2016-07-02 DIAGNOSIS — I890 Lymphedema, not elsewhere classified: Secondary | ICD-10-CM | POA: Diagnosis not present

## 2016-07-02 DIAGNOSIS — I87313 Chronic venous hypertension (idiopathic) with ulcer of bilateral lower extremity: Secondary | ICD-10-CM | POA: Diagnosis not present

## 2016-07-02 DIAGNOSIS — I872 Venous insufficiency (chronic) (peripheral): Secondary | ICD-10-CM | POA: Diagnosis not present

## 2016-07-02 DIAGNOSIS — L97212 Non-pressure chronic ulcer of right calf with fat layer exposed: Secondary | ICD-10-CM | POA: Diagnosis not present

## 2016-07-02 DIAGNOSIS — L97821 Non-pressure chronic ulcer of other part of left lower leg limited to breakdown of skin: Secondary | ICD-10-CM | POA: Diagnosis not present

## 2016-07-02 DIAGNOSIS — L97222 Non-pressure chronic ulcer of left calf with fat layer exposed: Secondary | ICD-10-CM | POA: Diagnosis not present

## 2016-07-02 DIAGNOSIS — L97811 Non-pressure chronic ulcer of other part of right lower leg limited to breakdown of skin: Secondary | ICD-10-CM | POA: Diagnosis not present

## 2016-07-05 DIAGNOSIS — L97222 Non-pressure chronic ulcer of left calf with fat layer exposed: Secondary | ICD-10-CM | POA: Diagnosis not present

## 2016-07-05 DIAGNOSIS — L97812 Non-pressure chronic ulcer of other part of right lower leg with fat layer exposed: Secondary | ICD-10-CM | POA: Diagnosis not present

## 2016-07-05 DIAGNOSIS — L97822 Non-pressure chronic ulcer of other part of left lower leg with fat layer exposed: Secondary | ICD-10-CM | POA: Diagnosis not present

## 2016-07-05 DIAGNOSIS — L97212 Non-pressure chronic ulcer of right calf with fat layer exposed: Secondary | ICD-10-CM | POA: Diagnosis not present

## 2016-07-05 DIAGNOSIS — I739 Peripheral vascular disease, unspecified: Secondary | ICD-10-CM | POA: Diagnosis not present

## 2016-07-05 DIAGNOSIS — I872 Venous insufficiency (chronic) (peripheral): Secondary | ICD-10-CM | POA: Diagnosis not present

## 2016-07-07 DIAGNOSIS — L97812 Non-pressure chronic ulcer of other part of right lower leg with fat layer exposed: Secondary | ICD-10-CM | POA: Diagnosis not present

## 2016-07-07 DIAGNOSIS — L97212 Non-pressure chronic ulcer of right calf with fat layer exposed: Secondary | ICD-10-CM | POA: Diagnosis not present

## 2016-07-07 DIAGNOSIS — L97222 Non-pressure chronic ulcer of left calf with fat layer exposed: Secondary | ICD-10-CM | POA: Diagnosis not present

## 2016-07-07 DIAGNOSIS — I872 Venous insufficiency (chronic) (peripheral): Secondary | ICD-10-CM | POA: Diagnosis not present

## 2016-07-07 DIAGNOSIS — I739 Peripheral vascular disease, unspecified: Secondary | ICD-10-CM | POA: Diagnosis not present

## 2016-07-07 DIAGNOSIS — L97822 Non-pressure chronic ulcer of other part of left lower leg with fat layer exposed: Secondary | ICD-10-CM | POA: Diagnosis not present

## 2016-07-14 DIAGNOSIS — I739 Peripheral vascular disease, unspecified: Secondary | ICD-10-CM | POA: Diagnosis not present

## 2016-07-14 DIAGNOSIS — L97812 Non-pressure chronic ulcer of other part of right lower leg with fat layer exposed: Secondary | ICD-10-CM | POA: Diagnosis not present

## 2016-07-14 DIAGNOSIS — L97822 Non-pressure chronic ulcer of other part of left lower leg with fat layer exposed: Secondary | ICD-10-CM | POA: Diagnosis not present

## 2016-07-14 DIAGNOSIS — L97222 Non-pressure chronic ulcer of left calf with fat layer exposed: Secondary | ICD-10-CM | POA: Diagnosis not present

## 2016-07-14 DIAGNOSIS — L97212 Non-pressure chronic ulcer of right calf with fat layer exposed: Secondary | ICD-10-CM | POA: Diagnosis not present

## 2016-07-14 DIAGNOSIS — I872 Venous insufficiency (chronic) (peripheral): Secondary | ICD-10-CM | POA: Diagnosis not present

## 2016-07-16 DIAGNOSIS — I87313 Chronic venous hypertension (idiopathic) with ulcer of bilateral lower extremity: Secondary | ICD-10-CM | POA: Diagnosis not present

## 2016-07-16 DIAGNOSIS — I890 Lymphedema, not elsewhere classified: Secondary | ICD-10-CM | POA: Diagnosis not present

## 2016-07-16 DIAGNOSIS — L97212 Non-pressure chronic ulcer of right calf with fat layer exposed: Secondary | ICD-10-CM | POA: Diagnosis not present

## 2016-07-16 DIAGNOSIS — L97222 Non-pressure chronic ulcer of left calf with fat layer exposed: Secondary | ICD-10-CM | POA: Diagnosis not present

## 2016-07-16 DIAGNOSIS — L97822 Non-pressure chronic ulcer of other part of left lower leg with fat layer exposed: Secondary | ICD-10-CM | POA: Diagnosis not present

## 2016-07-16 DIAGNOSIS — L97812 Non-pressure chronic ulcer of other part of right lower leg with fat layer exposed: Secondary | ICD-10-CM | POA: Diagnosis not present

## 2016-07-16 DIAGNOSIS — I872 Venous insufficiency (chronic) (peripheral): Secondary | ICD-10-CM | POA: Diagnosis not present

## 2016-07-21 DIAGNOSIS — L97222 Non-pressure chronic ulcer of left calf with fat layer exposed: Secondary | ICD-10-CM | POA: Diagnosis not present

## 2016-07-21 DIAGNOSIS — I872 Venous insufficiency (chronic) (peripheral): Secondary | ICD-10-CM | POA: Diagnosis not present

## 2016-07-21 DIAGNOSIS — L97822 Non-pressure chronic ulcer of other part of left lower leg with fat layer exposed: Secondary | ICD-10-CM | POA: Diagnosis not present

## 2016-07-21 DIAGNOSIS — I739 Peripheral vascular disease, unspecified: Secondary | ICD-10-CM | POA: Diagnosis not present

## 2016-07-21 DIAGNOSIS — L97212 Non-pressure chronic ulcer of right calf with fat layer exposed: Secondary | ICD-10-CM | POA: Diagnosis not present

## 2016-07-21 DIAGNOSIS — L97812 Non-pressure chronic ulcer of other part of right lower leg with fat layer exposed: Secondary | ICD-10-CM | POA: Diagnosis not present

## 2016-07-27 DIAGNOSIS — J019 Acute sinusitis, unspecified: Secondary | ICD-10-CM | POA: Diagnosis not present

## 2016-07-27 DIAGNOSIS — R6889 Other general symptoms and signs: Secondary | ICD-10-CM | POA: Diagnosis not present

## 2016-07-27 DIAGNOSIS — Z6841 Body Mass Index (BMI) 40.0 and over, adult: Secondary | ICD-10-CM | POA: Diagnosis not present

## 2016-07-28 DIAGNOSIS — L97812 Non-pressure chronic ulcer of other part of right lower leg with fat layer exposed: Secondary | ICD-10-CM | POA: Diagnosis not present

## 2016-07-28 DIAGNOSIS — I890 Lymphedema, not elsewhere classified: Secondary | ICD-10-CM | POA: Diagnosis not present

## 2016-07-28 DIAGNOSIS — L97822 Non-pressure chronic ulcer of other part of left lower leg with fat layer exposed: Secondary | ICD-10-CM | POA: Diagnosis not present

## 2016-07-28 DIAGNOSIS — L97811 Non-pressure chronic ulcer of other part of right lower leg limited to breakdown of skin: Secondary | ICD-10-CM | POA: Diagnosis not present

## 2016-07-28 DIAGNOSIS — L97821 Non-pressure chronic ulcer of other part of left lower leg limited to breakdown of skin: Secondary | ICD-10-CM | POA: Diagnosis not present

## 2016-07-28 DIAGNOSIS — I872 Venous insufficiency (chronic) (peripheral): Secondary | ICD-10-CM | POA: Diagnosis not present

## 2016-08-06 DIAGNOSIS — I890 Lymphedema, not elsewhere classified: Secondary | ICD-10-CM | POA: Diagnosis not present

## 2016-08-06 DIAGNOSIS — L97811 Non-pressure chronic ulcer of other part of right lower leg limited to breakdown of skin: Secondary | ICD-10-CM | POA: Diagnosis not present

## 2016-08-06 DIAGNOSIS — L97222 Non-pressure chronic ulcer of left calf with fat layer exposed: Secondary | ICD-10-CM | POA: Diagnosis not present

## 2016-08-06 DIAGNOSIS — I87313 Chronic venous hypertension (idiopathic) with ulcer of bilateral lower extremity: Secondary | ICD-10-CM | POA: Diagnosis not present

## 2016-08-06 DIAGNOSIS — L97212 Non-pressure chronic ulcer of right calf with fat layer exposed: Secondary | ICD-10-CM | POA: Diagnosis not present

## 2016-08-06 DIAGNOSIS — I872 Venous insufficiency (chronic) (peripheral): Secondary | ICD-10-CM | POA: Diagnosis not present

## 2016-08-06 DIAGNOSIS — L97812 Non-pressure chronic ulcer of other part of right lower leg with fat layer exposed: Secondary | ICD-10-CM | POA: Diagnosis not present

## 2016-08-06 DIAGNOSIS — L97821 Non-pressure chronic ulcer of other part of left lower leg limited to breakdown of skin: Secondary | ICD-10-CM | POA: Diagnosis not present

## 2016-08-06 DIAGNOSIS — L97822 Non-pressure chronic ulcer of other part of left lower leg with fat layer exposed: Secondary | ICD-10-CM | POA: Diagnosis not present

## 2016-08-13 DIAGNOSIS — L97821 Non-pressure chronic ulcer of other part of left lower leg limited to breakdown of skin: Secondary | ICD-10-CM | POA: Diagnosis not present

## 2016-08-13 DIAGNOSIS — I87313 Chronic venous hypertension (idiopathic) with ulcer of bilateral lower extremity: Secondary | ICD-10-CM | POA: Diagnosis not present

## 2016-08-13 DIAGNOSIS — L97822 Non-pressure chronic ulcer of other part of left lower leg with fat layer exposed: Secondary | ICD-10-CM | POA: Diagnosis not present

## 2016-08-13 DIAGNOSIS — L97812 Non-pressure chronic ulcer of other part of right lower leg with fat layer exposed: Secondary | ICD-10-CM | POA: Diagnosis not present

## 2016-08-13 DIAGNOSIS — I872 Venous insufficiency (chronic) (peripheral): Secondary | ICD-10-CM | POA: Diagnosis not present

## 2016-08-13 DIAGNOSIS — L97811 Non-pressure chronic ulcer of other part of right lower leg limited to breakdown of skin: Secondary | ICD-10-CM | POA: Diagnosis not present

## 2016-08-13 DIAGNOSIS — L97222 Non-pressure chronic ulcer of left calf with fat layer exposed: Secondary | ICD-10-CM | POA: Diagnosis not present

## 2016-08-13 DIAGNOSIS — I890 Lymphedema, not elsewhere classified: Secondary | ICD-10-CM | POA: Diagnosis not present

## 2016-08-13 DIAGNOSIS — L97212 Non-pressure chronic ulcer of right calf with fat layer exposed: Secondary | ICD-10-CM | POA: Diagnosis not present

## 2016-08-16 DIAGNOSIS — J9690 Respiratory failure, unspecified, unspecified whether with hypoxia or hypercapnia: Secondary | ICD-10-CM | POA: Diagnosis not present

## 2016-08-16 DIAGNOSIS — I87332 Chronic venous hypertension (idiopathic) with ulcer and inflammation of left lower extremity: Secondary | ICD-10-CM | POA: Diagnosis not present

## 2016-08-16 DIAGNOSIS — I87331 Chronic venous hypertension (idiopathic) with ulcer and inflammation of right lower extremity: Secondary | ICD-10-CM | POA: Diagnosis not present

## 2016-08-16 DIAGNOSIS — M79662 Pain in left lower leg: Secondary | ICD-10-CM | POA: Diagnosis not present

## 2016-08-16 DIAGNOSIS — M79661 Pain in right lower leg: Secondary | ICD-10-CM | POA: Diagnosis not present

## 2016-08-16 DIAGNOSIS — L97819 Non-pressure chronic ulcer of other part of right lower leg with unspecified severity: Secondary | ICD-10-CM | POA: Diagnosis not present

## 2016-08-16 DIAGNOSIS — I872 Venous insufficiency (chronic) (peripheral): Secondary | ICD-10-CM | POA: Diagnosis not present

## 2016-08-20 DIAGNOSIS — L97222 Non-pressure chronic ulcer of left calf with fat layer exposed: Secondary | ICD-10-CM | POA: Diagnosis not present

## 2016-08-20 DIAGNOSIS — L97811 Non-pressure chronic ulcer of other part of right lower leg limited to breakdown of skin: Secondary | ICD-10-CM | POA: Diagnosis not present

## 2016-08-20 DIAGNOSIS — I890 Lymphedema, not elsewhere classified: Secondary | ICD-10-CM | POA: Diagnosis not present

## 2016-08-20 DIAGNOSIS — I87313 Chronic venous hypertension (idiopathic) with ulcer of bilateral lower extremity: Secondary | ICD-10-CM | POA: Diagnosis not present

## 2016-08-20 DIAGNOSIS — I872 Venous insufficiency (chronic) (peripheral): Secondary | ICD-10-CM | POA: Diagnosis not present

## 2016-08-20 DIAGNOSIS — L97822 Non-pressure chronic ulcer of other part of left lower leg with fat layer exposed: Secondary | ICD-10-CM | POA: Diagnosis not present

## 2016-08-20 DIAGNOSIS — L97812 Non-pressure chronic ulcer of other part of right lower leg with fat layer exposed: Secondary | ICD-10-CM | POA: Diagnosis not present

## 2016-08-20 DIAGNOSIS — I509 Heart failure, unspecified: Secondary | ICD-10-CM | POA: Diagnosis not present

## 2016-08-20 DIAGNOSIS — I110 Hypertensive heart disease with heart failure: Secondary | ICD-10-CM | POA: Diagnosis not present

## 2016-08-20 DIAGNOSIS — G4730 Sleep apnea, unspecified: Secondary | ICD-10-CM | POA: Diagnosis not present

## 2016-08-20 DIAGNOSIS — L97821 Non-pressure chronic ulcer of other part of left lower leg limited to breakdown of skin: Secondary | ICD-10-CM | POA: Diagnosis not present

## 2016-08-20 DIAGNOSIS — I4891 Unspecified atrial fibrillation: Secondary | ICD-10-CM | POA: Diagnosis not present

## 2016-08-20 DIAGNOSIS — G894 Chronic pain syndrome: Secondary | ICD-10-CM | POA: Diagnosis not present

## 2016-08-20 DIAGNOSIS — L97212 Non-pressure chronic ulcer of right calf with fat layer exposed: Secondary | ICD-10-CM | POA: Diagnosis not present

## 2016-08-20 DIAGNOSIS — I739 Peripheral vascular disease, unspecified: Secondary | ICD-10-CM | POA: Diagnosis not present

## 2016-08-24 DIAGNOSIS — Z1389 Encounter for screening for other disorder: Secondary | ICD-10-CM | POA: Diagnosis not present

## 2016-08-24 DIAGNOSIS — I83009 Varicose veins of unspecified lower extremity with ulcer of unspecified site: Secondary | ICD-10-CM | POA: Diagnosis not present

## 2016-08-24 DIAGNOSIS — Z6841 Body Mass Index (BMI) 40.0 and over, adult: Secondary | ICD-10-CM | POA: Diagnosis not present

## 2016-08-24 DIAGNOSIS — Z9181 History of falling: Secondary | ICD-10-CM | POA: Diagnosis not present

## 2016-08-24 DIAGNOSIS — L409 Psoriasis, unspecified: Secondary | ICD-10-CM | POA: Diagnosis not present

## 2016-08-24 DIAGNOSIS — I4891 Unspecified atrial fibrillation: Secondary | ICD-10-CM | POA: Diagnosis not present

## 2016-08-24 DIAGNOSIS — G4733 Obstructive sleep apnea (adult) (pediatric): Secondary | ICD-10-CM | POA: Diagnosis not present

## 2016-08-24 DIAGNOSIS — E349 Endocrine disorder, unspecified: Secondary | ICD-10-CM | POA: Diagnosis not present

## 2016-08-24 DIAGNOSIS — E876 Hypokalemia: Secondary | ICD-10-CM | POA: Diagnosis not present

## 2016-08-24 DIAGNOSIS — J019 Acute sinusitis, unspecified: Secondary | ICD-10-CM | POA: Diagnosis not present

## 2016-08-24 DIAGNOSIS — I10 Essential (primary) hypertension: Secondary | ICD-10-CM | POA: Diagnosis not present

## 2016-08-27 DIAGNOSIS — I890 Lymphedema, not elsewhere classified: Secondary | ICD-10-CM | POA: Diagnosis not present

## 2016-08-27 DIAGNOSIS — L97811 Non-pressure chronic ulcer of other part of right lower leg limited to breakdown of skin: Secondary | ICD-10-CM | POA: Diagnosis not present

## 2016-08-27 DIAGNOSIS — I87313 Chronic venous hypertension (idiopathic) with ulcer of bilateral lower extremity: Secondary | ICD-10-CM | POA: Diagnosis not present

## 2016-08-27 DIAGNOSIS — L97822 Non-pressure chronic ulcer of other part of left lower leg with fat layer exposed: Secondary | ICD-10-CM | POA: Diagnosis not present

## 2016-08-27 DIAGNOSIS — L97812 Non-pressure chronic ulcer of other part of right lower leg with fat layer exposed: Secondary | ICD-10-CM | POA: Diagnosis not present

## 2016-08-27 DIAGNOSIS — L97212 Non-pressure chronic ulcer of right calf with fat layer exposed: Secondary | ICD-10-CM | POA: Diagnosis not present

## 2016-08-27 DIAGNOSIS — L97222 Non-pressure chronic ulcer of left calf with fat layer exposed: Secondary | ICD-10-CM | POA: Diagnosis not present

## 2016-08-27 DIAGNOSIS — L97821 Non-pressure chronic ulcer of other part of left lower leg limited to breakdown of skin: Secondary | ICD-10-CM | POA: Diagnosis not present

## 2016-08-27 DIAGNOSIS — I872 Venous insufficiency (chronic) (peripheral): Secondary | ICD-10-CM | POA: Diagnosis not present

## 2016-09-01 DIAGNOSIS — I509 Heart failure, unspecified: Secondary | ICD-10-CM | POA: Diagnosis not present

## 2016-09-01 DIAGNOSIS — R0789 Other chest pain: Secondary | ICD-10-CM | POA: Diagnosis not present

## 2016-09-01 DIAGNOSIS — Z7982 Long term (current) use of aspirin: Secondary | ICD-10-CM | POA: Diagnosis not present

## 2016-09-01 DIAGNOSIS — R079 Chest pain, unspecified: Secondary | ICD-10-CM | POA: Diagnosis not present

## 2016-09-01 DIAGNOSIS — M7989 Other specified soft tissue disorders: Secondary | ICD-10-CM | POA: Diagnosis not present

## 2016-09-01 DIAGNOSIS — Z79899 Other long term (current) drug therapy: Secondary | ICD-10-CM | POA: Diagnosis not present

## 2016-09-01 DIAGNOSIS — R600 Localized edema: Secondary | ICD-10-CM | POA: Diagnosis not present

## 2016-09-01 DIAGNOSIS — M1990 Unspecified osteoarthritis, unspecified site: Secondary | ICD-10-CM | POA: Diagnosis present

## 2016-09-01 DIAGNOSIS — L03116 Cellulitis of left lower limb: Secondary | ICD-10-CM | POA: Diagnosis not present

## 2016-09-01 DIAGNOSIS — Z880 Allergy status to penicillin: Secondary | ICD-10-CM | POA: Diagnosis not present

## 2016-09-01 DIAGNOSIS — I110 Hypertensive heart disease with heart failure: Secondary | ICD-10-CM | POA: Diagnosis not present

## 2016-09-01 DIAGNOSIS — Z8614 Personal history of Methicillin resistant Staphylococcus aureus infection: Secondary | ICD-10-CM | POA: Diagnosis not present

## 2016-09-01 DIAGNOSIS — Z6841 Body Mass Index (BMI) 40.0 and over, adult: Secondary | ICD-10-CM | POA: Diagnosis not present

## 2016-09-01 DIAGNOSIS — E876 Hypokalemia: Secondary | ICD-10-CM | POA: Diagnosis not present

## 2016-09-01 DIAGNOSIS — I10 Essential (primary) hypertension: Secondary | ICD-10-CM | POA: Diagnosis not present

## 2016-09-01 DIAGNOSIS — Z887 Allergy status to serum and vaccine status: Secondary | ICD-10-CM | POA: Diagnosis not present

## 2016-09-02 DIAGNOSIS — E876 Hypokalemia: Secondary | ICD-10-CM

## 2016-09-10 DIAGNOSIS — I10 Essential (primary) hypertension: Secondary | ICD-10-CM | POA: Diagnosis not present

## 2016-09-10 DIAGNOSIS — L03116 Cellulitis of left lower limb: Secondary | ICD-10-CM | POA: Diagnosis not present

## 2016-09-10 DIAGNOSIS — Z7982 Long term (current) use of aspirin: Secondary | ICD-10-CM | POA: Diagnosis not present

## 2016-09-10 DIAGNOSIS — Z9181 History of falling: Secondary | ICD-10-CM | POA: Diagnosis not present

## 2016-09-10 DIAGNOSIS — Z792 Long term (current) use of antibiotics: Secondary | ICD-10-CM | POA: Diagnosis not present

## 2016-09-10 DIAGNOSIS — L97221 Non-pressure chronic ulcer of left calf limited to breakdown of skin: Secondary | ICD-10-CM | POA: Diagnosis not present

## 2016-09-10 DIAGNOSIS — L03115 Cellulitis of right lower limb: Secondary | ICD-10-CM | POA: Diagnosis not present

## 2016-09-10 DIAGNOSIS — I872 Venous insufficiency (chronic) (peripheral): Secondary | ICD-10-CM | POA: Diagnosis not present

## 2016-09-10 DIAGNOSIS — L97211 Non-pressure chronic ulcer of right calf limited to breakdown of skin: Secondary | ICD-10-CM | POA: Diagnosis not present

## 2016-09-10 DIAGNOSIS — Z6841 Body Mass Index (BMI) 40.0 and over, adult: Secondary | ICD-10-CM | POA: Diagnosis not present

## 2016-09-10 DIAGNOSIS — L97811 Non-pressure chronic ulcer of other part of right lower leg limited to breakdown of skin: Secondary | ICD-10-CM | POA: Diagnosis not present

## 2016-09-13 DIAGNOSIS — L03116 Cellulitis of left lower limb: Secondary | ICD-10-CM | POA: Diagnosis not present

## 2016-09-13 DIAGNOSIS — L97221 Non-pressure chronic ulcer of left calf limited to breakdown of skin: Secondary | ICD-10-CM | POA: Diagnosis not present

## 2016-09-13 DIAGNOSIS — L97211 Non-pressure chronic ulcer of right calf limited to breakdown of skin: Secondary | ICD-10-CM | POA: Diagnosis not present

## 2016-09-13 DIAGNOSIS — L97811 Non-pressure chronic ulcer of other part of right lower leg limited to breakdown of skin: Secondary | ICD-10-CM | POA: Diagnosis not present

## 2016-09-13 DIAGNOSIS — L03115 Cellulitis of right lower limb: Secondary | ICD-10-CM | POA: Diagnosis not present

## 2016-09-13 DIAGNOSIS — I872 Venous insufficiency (chronic) (peripheral): Secondary | ICD-10-CM | POA: Diagnosis not present

## 2016-09-14 DIAGNOSIS — I872 Venous insufficiency (chronic) (peripheral): Secondary | ICD-10-CM | POA: Diagnosis not present

## 2016-09-14 DIAGNOSIS — L97811 Non-pressure chronic ulcer of other part of right lower leg limited to breakdown of skin: Secondary | ICD-10-CM | POA: Diagnosis not present

## 2016-09-14 DIAGNOSIS — L03116 Cellulitis of left lower limb: Secondary | ICD-10-CM | POA: Diagnosis not present

## 2016-09-14 DIAGNOSIS — L03115 Cellulitis of right lower limb: Secondary | ICD-10-CM | POA: Diagnosis not present

## 2016-09-14 DIAGNOSIS — L97221 Non-pressure chronic ulcer of left calf limited to breakdown of skin: Secondary | ICD-10-CM | POA: Diagnosis not present

## 2016-09-14 DIAGNOSIS — L97211 Non-pressure chronic ulcer of right calf limited to breakdown of skin: Secondary | ICD-10-CM | POA: Diagnosis not present

## 2016-09-15 DIAGNOSIS — I87313 Chronic venous hypertension (idiopathic) with ulcer of bilateral lower extremity: Secondary | ICD-10-CM | POA: Diagnosis not present

## 2016-09-15 DIAGNOSIS — L97222 Non-pressure chronic ulcer of left calf with fat layer exposed: Secondary | ICD-10-CM | POA: Diagnosis not present

## 2016-09-15 DIAGNOSIS — Z6841 Body Mass Index (BMI) 40.0 and over, adult: Secondary | ICD-10-CM | POA: Diagnosis not present

## 2016-09-15 DIAGNOSIS — I10 Essential (primary) hypertension: Secondary | ICD-10-CM | POA: Diagnosis not present

## 2016-09-15 DIAGNOSIS — L97211 Non-pressure chronic ulcer of right calf limited to breakdown of skin: Secondary | ICD-10-CM | POA: Diagnosis not present

## 2016-09-15 DIAGNOSIS — G4730 Sleep apnea, unspecified: Secondary | ICD-10-CM | POA: Diagnosis not present

## 2016-09-15 DIAGNOSIS — L03115 Cellulitis of right lower limb: Secondary | ICD-10-CM | POA: Diagnosis not present

## 2016-09-15 DIAGNOSIS — E876 Hypokalemia: Secondary | ICD-10-CM | POA: Diagnosis not present

## 2016-09-15 DIAGNOSIS — I872 Venous insufficiency (chronic) (peripheral): Secondary | ICD-10-CM | POA: Diagnosis not present

## 2016-09-15 DIAGNOSIS — L97212 Non-pressure chronic ulcer of right calf with fat layer exposed: Secondary | ICD-10-CM | POA: Diagnosis not present

## 2016-09-15 DIAGNOSIS — L97221 Non-pressure chronic ulcer of left calf limited to breakdown of skin: Secondary | ICD-10-CM | POA: Diagnosis not present

## 2016-09-15 DIAGNOSIS — L97811 Non-pressure chronic ulcer of other part of right lower leg limited to breakdown of skin: Secondary | ICD-10-CM | POA: Diagnosis not present

## 2016-09-15 DIAGNOSIS — I110 Hypertensive heart disease with heart failure: Secondary | ICD-10-CM | POA: Diagnosis not present

## 2016-09-15 DIAGNOSIS — L03116 Cellulitis of left lower limb: Secondary | ICD-10-CM | POA: Diagnosis not present

## 2016-09-15 DIAGNOSIS — I739 Peripheral vascular disease, unspecified: Secondary | ICD-10-CM | POA: Diagnosis not present

## 2016-09-15 DIAGNOSIS — L97821 Non-pressure chronic ulcer of other part of left lower leg limited to breakdown of skin: Secondary | ICD-10-CM | POA: Diagnosis not present

## 2016-09-15 DIAGNOSIS — I4891 Unspecified atrial fibrillation: Secondary | ICD-10-CM | POA: Diagnosis not present

## 2016-09-15 DIAGNOSIS — I83009 Varicose veins of unspecified lower extremity with ulcer of unspecified site: Secondary | ICD-10-CM | POA: Diagnosis not present

## 2016-09-15 DIAGNOSIS — I509 Heart failure, unspecified: Secondary | ICD-10-CM | POA: Diagnosis not present

## 2016-09-17 DIAGNOSIS — L97211 Non-pressure chronic ulcer of right calf limited to breakdown of skin: Secondary | ICD-10-CM | POA: Diagnosis not present

## 2016-09-17 DIAGNOSIS — L97811 Non-pressure chronic ulcer of other part of right lower leg limited to breakdown of skin: Secondary | ICD-10-CM | POA: Diagnosis not present

## 2016-09-17 DIAGNOSIS — L03116 Cellulitis of left lower limb: Secondary | ICD-10-CM | POA: Diagnosis not present

## 2016-09-17 DIAGNOSIS — L97221 Non-pressure chronic ulcer of left calf limited to breakdown of skin: Secondary | ICD-10-CM | POA: Diagnosis not present

## 2016-09-17 DIAGNOSIS — I872 Venous insufficiency (chronic) (peripheral): Secondary | ICD-10-CM | POA: Diagnosis not present

## 2016-09-17 DIAGNOSIS — L03115 Cellulitis of right lower limb: Secondary | ICD-10-CM | POA: Diagnosis not present

## 2016-09-22 DIAGNOSIS — L97211 Non-pressure chronic ulcer of right calf limited to breakdown of skin: Secondary | ICD-10-CM | POA: Diagnosis not present

## 2016-09-22 DIAGNOSIS — L03116 Cellulitis of left lower limb: Secondary | ICD-10-CM | POA: Diagnosis not present

## 2016-09-22 DIAGNOSIS — L03115 Cellulitis of right lower limb: Secondary | ICD-10-CM | POA: Diagnosis not present

## 2016-09-22 DIAGNOSIS — L97811 Non-pressure chronic ulcer of other part of right lower leg limited to breakdown of skin: Secondary | ICD-10-CM | POA: Diagnosis not present

## 2016-09-22 DIAGNOSIS — I872 Venous insufficiency (chronic) (peripheral): Secondary | ICD-10-CM | POA: Diagnosis not present

## 2016-09-22 DIAGNOSIS — L97221 Non-pressure chronic ulcer of left calf limited to breakdown of skin: Secondary | ICD-10-CM | POA: Diagnosis not present

## 2016-09-23 DIAGNOSIS — L97221 Non-pressure chronic ulcer of left calf limited to breakdown of skin: Secondary | ICD-10-CM | POA: Diagnosis not present

## 2016-09-23 DIAGNOSIS — L03115 Cellulitis of right lower limb: Secondary | ICD-10-CM | POA: Diagnosis not present

## 2016-09-23 DIAGNOSIS — L97811 Non-pressure chronic ulcer of other part of right lower leg limited to breakdown of skin: Secondary | ICD-10-CM | POA: Diagnosis not present

## 2016-09-23 DIAGNOSIS — I872 Venous insufficiency (chronic) (peripheral): Secondary | ICD-10-CM | POA: Diagnosis not present

## 2016-09-23 DIAGNOSIS — L97211 Non-pressure chronic ulcer of right calf limited to breakdown of skin: Secondary | ICD-10-CM | POA: Diagnosis not present

## 2016-09-23 DIAGNOSIS — L03116 Cellulitis of left lower limb: Secondary | ICD-10-CM | POA: Diagnosis not present

## 2016-09-24 DIAGNOSIS — L03116 Cellulitis of left lower limb: Secondary | ICD-10-CM | POA: Diagnosis not present

## 2016-09-24 DIAGNOSIS — L03115 Cellulitis of right lower limb: Secondary | ICD-10-CM | POA: Diagnosis not present

## 2016-09-24 DIAGNOSIS — L97211 Non-pressure chronic ulcer of right calf limited to breakdown of skin: Secondary | ICD-10-CM | POA: Diagnosis not present

## 2016-09-24 DIAGNOSIS — L97221 Non-pressure chronic ulcer of left calf limited to breakdown of skin: Secondary | ICD-10-CM | POA: Diagnosis not present

## 2016-09-24 DIAGNOSIS — I872 Venous insufficiency (chronic) (peripheral): Secondary | ICD-10-CM | POA: Diagnosis not present

## 2016-09-24 DIAGNOSIS — L97811 Non-pressure chronic ulcer of other part of right lower leg limited to breakdown of skin: Secondary | ICD-10-CM | POA: Diagnosis not present

## 2016-09-28 DIAGNOSIS — L97811 Non-pressure chronic ulcer of other part of right lower leg limited to breakdown of skin: Secondary | ICD-10-CM | POA: Diagnosis not present

## 2016-09-28 DIAGNOSIS — L03116 Cellulitis of left lower limb: Secondary | ICD-10-CM | POA: Diagnosis not present

## 2016-09-28 DIAGNOSIS — L97211 Non-pressure chronic ulcer of right calf limited to breakdown of skin: Secondary | ICD-10-CM | POA: Diagnosis not present

## 2016-09-28 DIAGNOSIS — L97221 Non-pressure chronic ulcer of left calf limited to breakdown of skin: Secondary | ICD-10-CM | POA: Diagnosis not present

## 2016-09-28 DIAGNOSIS — L03115 Cellulitis of right lower limb: Secondary | ICD-10-CM | POA: Diagnosis not present

## 2016-09-28 DIAGNOSIS — I872 Venous insufficiency (chronic) (peripheral): Secondary | ICD-10-CM | POA: Diagnosis not present

## 2016-09-29 DIAGNOSIS — I872 Venous insufficiency (chronic) (peripheral): Secondary | ICD-10-CM | POA: Diagnosis not present

## 2016-09-29 DIAGNOSIS — L97211 Non-pressure chronic ulcer of right calf limited to breakdown of skin: Secondary | ICD-10-CM | POA: Diagnosis not present

## 2016-09-29 DIAGNOSIS — L03115 Cellulitis of right lower limb: Secondary | ICD-10-CM | POA: Diagnosis not present

## 2016-09-29 DIAGNOSIS — L03116 Cellulitis of left lower limb: Secondary | ICD-10-CM | POA: Diagnosis not present

## 2016-09-29 DIAGNOSIS — L97221 Non-pressure chronic ulcer of left calf limited to breakdown of skin: Secondary | ICD-10-CM | POA: Diagnosis not present

## 2016-09-29 DIAGNOSIS — L97811 Non-pressure chronic ulcer of other part of right lower leg limited to breakdown of skin: Secondary | ICD-10-CM | POA: Diagnosis not present

## 2016-09-30 DIAGNOSIS — L03116 Cellulitis of left lower limb: Secondary | ICD-10-CM | POA: Diagnosis not present

## 2016-09-30 DIAGNOSIS — L97211 Non-pressure chronic ulcer of right calf limited to breakdown of skin: Secondary | ICD-10-CM | POA: Diagnosis not present

## 2016-09-30 DIAGNOSIS — L97221 Non-pressure chronic ulcer of left calf limited to breakdown of skin: Secondary | ICD-10-CM | POA: Diagnosis not present

## 2016-09-30 DIAGNOSIS — I872 Venous insufficiency (chronic) (peripheral): Secondary | ICD-10-CM | POA: Diagnosis not present

## 2016-09-30 DIAGNOSIS — L97811 Non-pressure chronic ulcer of other part of right lower leg limited to breakdown of skin: Secondary | ICD-10-CM | POA: Diagnosis not present

## 2016-09-30 DIAGNOSIS — L03115 Cellulitis of right lower limb: Secondary | ICD-10-CM | POA: Diagnosis not present

## 2016-10-01 DIAGNOSIS — L97811 Non-pressure chronic ulcer of other part of right lower leg limited to breakdown of skin: Secondary | ICD-10-CM | POA: Diagnosis not present

## 2016-10-01 DIAGNOSIS — L97222 Non-pressure chronic ulcer of left calf with fat layer exposed: Secondary | ICD-10-CM | POA: Diagnosis not present

## 2016-10-01 DIAGNOSIS — I890 Lymphedema, not elsewhere classified: Secondary | ICD-10-CM | POA: Diagnosis not present

## 2016-10-01 DIAGNOSIS — L97821 Non-pressure chronic ulcer of other part of left lower leg limited to breakdown of skin: Secondary | ICD-10-CM | POA: Diagnosis not present

## 2016-10-01 DIAGNOSIS — I872 Venous insufficiency (chronic) (peripheral): Secondary | ICD-10-CM | POA: Diagnosis not present

## 2016-10-01 DIAGNOSIS — I87313 Chronic venous hypertension (idiopathic) with ulcer of bilateral lower extremity: Secondary | ICD-10-CM | POA: Diagnosis not present

## 2016-10-01 DIAGNOSIS — L97212 Non-pressure chronic ulcer of right calf with fat layer exposed: Secondary | ICD-10-CM | POA: Diagnosis not present

## 2016-10-04 DIAGNOSIS — L03115 Cellulitis of right lower limb: Secondary | ICD-10-CM | POA: Diagnosis not present

## 2016-10-04 DIAGNOSIS — L03116 Cellulitis of left lower limb: Secondary | ICD-10-CM | POA: Diagnosis not present

## 2016-10-04 DIAGNOSIS — L97811 Non-pressure chronic ulcer of other part of right lower leg limited to breakdown of skin: Secondary | ICD-10-CM | POA: Diagnosis not present

## 2016-10-04 DIAGNOSIS — L97221 Non-pressure chronic ulcer of left calf limited to breakdown of skin: Secondary | ICD-10-CM | POA: Diagnosis not present

## 2016-10-04 DIAGNOSIS — L97211 Non-pressure chronic ulcer of right calf limited to breakdown of skin: Secondary | ICD-10-CM | POA: Diagnosis not present

## 2016-10-04 DIAGNOSIS — I872 Venous insufficiency (chronic) (peripheral): Secondary | ICD-10-CM | POA: Diagnosis not present

## 2016-10-05 DIAGNOSIS — L03115 Cellulitis of right lower limb: Secondary | ICD-10-CM | POA: Diagnosis not present

## 2016-10-05 DIAGNOSIS — L97211 Non-pressure chronic ulcer of right calf limited to breakdown of skin: Secondary | ICD-10-CM | POA: Diagnosis not present

## 2016-10-05 DIAGNOSIS — L97221 Non-pressure chronic ulcer of left calf limited to breakdown of skin: Secondary | ICD-10-CM | POA: Diagnosis not present

## 2016-10-05 DIAGNOSIS — L97811 Non-pressure chronic ulcer of other part of right lower leg limited to breakdown of skin: Secondary | ICD-10-CM | POA: Diagnosis not present

## 2016-10-05 DIAGNOSIS — L03116 Cellulitis of left lower limb: Secondary | ICD-10-CM | POA: Diagnosis not present

## 2016-10-05 DIAGNOSIS — I872 Venous insufficiency (chronic) (peripheral): Secondary | ICD-10-CM | POA: Diagnosis not present

## 2016-10-08 DIAGNOSIS — I890 Lymphedema, not elsewhere classified: Secondary | ICD-10-CM | POA: Diagnosis not present

## 2016-10-08 DIAGNOSIS — I872 Venous insufficiency (chronic) (peripheral): Secondary | ICD-10-CM | POA: Diagnosis not present

## 2016-10-08 DIAGNOSIS — L97812 Non-pressure chronic ulcer of other part of right lower leg with fat layer exposed: Secondary | ICD-10-CM | POA: Diagnosis not present

## 2016-10-08 DIAGNOSIS — L97822 Non-pressure chronic ulcer of other part of left lower leg with fat layer exposed: Secondary | ICD-10-CM | POA: Diagnosis not present

## 2016-10-08 DIAGNOSIS — L97212 Non-pressure chronic ulcer of right calf with fat layer exposed: Secondary | ICD-10-CM | POA: Diagnosis not present

## 2016-10-08 DIAGNOSIS — L97821 Non-pressure chronic ulcer of other part of left lower leg limited to breakdown of skin: Secondary | ICD-10-CM | POA: Diagnosis not present

## 2016-10-08 DIAGNOSIS — I87313 Chronic venous hypertension (idiopathic) with ulcer of bilateral lower extremity: Secondary | ICD-10-CM | POA: Diagnosis not present

## 2016-10-08 DIAGNOSIS — L97811 Non-pressure chronic ulcer of other part of right lower leg limited to breakdown of skin: Secondary | ICD-10-CM | POA: Diagnosis not present

## 2016-10-08 DIAGNOSIS — L97222 Non-pressure chronic ulcer of left calf with fat layer exposed: Secondary | ICD-10-CM | POA: Diagnosis not present

## 2016-10-11 DIAGNOSIS — L97221 Non-pressure chronic ulcer of left calf limited to breakdown of skin: Secondary | ICD-10-CM | POA: Diagnosis not present

## 2016-10-11 DIAGNOSIS — I872 Venous insufficiency (chronic) (peripheral): Secondary | ICD-10-CM | POA: Diagnosis not present

## 2016-10-11 DIAGNOSIS — L03116 Cellulitis of left lower limb: Secondary | ICD-10-CM | POA: Diagnosis not present

## 2016-10-11 DIAGNOSIS — L97211 Non-pressure chronic ulcer of right calf limited to breakdown of skin: Secondary | ICD-10-CM | POA: Diagnosis not present

## 2016-10-11 DIAGNOSIS — L03115 Cellulitis of right lower limb: Secondary | ICD-10-CM | POA: Diagnosis not present

## 2016-10-11 DIAGNOSIS — L97811 Non-pressure chronic ulcer of other part of right lower leg limited to breakdown of skin: Secondary | ICD-10-CM | POA: Diagnosis not present

## 2016-10-15 DIAGNOSIS — L97821 Non-pressure chronic ulcer of other part of left lower leg limited to breakdown of skin: Secondary | ICD-10-CM | POA: Diagnosis not present

## 2016-10-15 DIAGNOSIS — I87313 Chronic venous hypertension (idiopathic) with ulcer of bilateral lower extremity: Secondary | ICD-10-CM | POA: Diagnosis not present

## 2016-10-15 DIAGNOSIS — L97212 Non-pressure chronic ulcer of right calf with fat layer exposed: Secondary | ICD-10-CM | POA: Diagnosis not present

## 2016-10-15 DIAGNOSIS — L97811 Non-pressure chronic ulcer of other part of right lower leg limited to breakdown of skin: Secondary | ICD-10-CM | POA: Diagnosis not present

## 2016-10-15 DIAGNOSIS — I890 Lymphedema, not elsewhere classified: Secondary | ICD-10-CM | POA: Diagnosis not present

## 2016-10-15 DIAGNOSIS — I872 Venous insufficiency (chronic) (peripheral): Secondary | ICD-10-CM | POA: Diagnosis not present

## 2016-10-15 DIAGNOSIS — L97222 Non-pressure chronic ulcer of left calf with fat layer exposed: Secondary | ICD-10-CM | POA: Diagnosis not present

## 2016-10-15 DIAGNOSIS — L97812 Non-pressure chronic ulcer of other part of right lower leg with fat layer exposed: Secondary | ICD-10-CM | POA: Diagnosis not present

## 2016-10-15 DIAGNOSIS — L97822 Non-pressure chronic ulcer of other part of left lower leg with fat layer exposed: Secondary | ICD-10-CM | POA: Diagnosis not present

## 2016-10-19 DIAGNOSIS — L03116 Cellulitis of left lower limb: Secondary | ICD-10-CM | POA: Diagnosis not present

## 2016-10-19 DIAGNOSIS — L97811 Non-pressure chronic ulcer of other part of right lower leg limited to breakdown of skin: Secondary | ICD-10-CM | POA: Diagnosis not present

## 2016-10-19 DIAGNOSIS — L97211 Non-pressure chronic ulcer of right calf limited to breakdown of skin: Secondary | ICD-10-CM | POA: Diagnosis not present

## 2016-10-19 DIAGNOSIS — I872 Venous insufficiency (chronic) (peripheral): Secondary | ICD-10-CM | POA: Diagnosis not present

## 2016-10-19 DIAGNOSIS — L03115 Cellulitis of right lower limb: Secondary | ICD-10-CM | POA: Diagnosis not present

## 2016-10-19 DIAGNOSIS — L97221 Non-pressure chronic ulcer of left calf limited to breakdown of skin: Secondary | ICD-10-CM | POA: Diagnosis not present

## 2016-10-22 DIAGNOSIS — I87313 Chronic venous hypertension (idiopathic) with ulcer of bilateral lower extremity: Secondary | ICD-10-CM | POA: Diagnosis not present

## 2016-10-22 DIAGNOSIS — L97821 Non-pressure chronic ulcer of other part of left lower leg limited to breakdown of skin: Secondary | ICD-10-CM | POA: Diagnosis not present

## 2016-10-22 DIAGNOSIS — L97212 Non-pressure chronic ulcer of right calf with fat layer exposed: Secondary | ICD-10-CM | POA: Diagnosis not present

## 2016-10-22 DIAGNOSIS — L97811 Non-pressure chronic ulcer of other part of right lower leg limited to breakdown of skin: Secondary | ICD-10-CM | POA: Diagnosis not present

## 2016-10-22 DIAGNOSIS — L97812 Non-pressure chronic ulcer of other part of right lower leg with fat layer exposed: Secondary | ICD-10-CM | POA: Diagnosis not present

## 2016-10-22 DIAGNOSIS — L97222 Non-pressure chronic ulcer of left calf with fat layer exposed: Secondary | ICD-10-CM | POA: Diagnosis not present

## 2016-10-22 DIAGNOSIS — I872 Venous insufficiency (chronic) (peripheral): Secondary | ICD-10-CM | POA: Diagnosis not present

## 2016-10-27 DIAGNOSIS — L97211 Non-pressure chronic ulcer of right calf limited to breakdown of skin: Secondary | ICD-10-CM | POA: Diagnosis not present

## 2016-10-27 DIAGNOSIS — L97221 Non-pressure chronic ulcer of left calf limited to breakdown of skin: Secondary | ICD-10-CM | POA: Diagnosis not present

## 2016-10-27 DIAGNOSIS — L03116 Cellulitis of left lower limb: Secondary | ICD-10-CM | POA: Diagnosis not present

## 2016-10-27 DIAGNOSIS — L97811 Non-pressure chronic ulcer of other part of right lower leg limited to breakdown of skin: Secondary | ICD-10-CM | POA: Diagnosis not present

## 2016-10-27 DIAGNOSIS — L03115 Cellulitis of right lower limb: Secondary | ICD-10-CM | POA: Diagnosis not present

## 2016-10-27 DIAGNOSIS — I872 Venous insufficiency (chronic) (peripheral): Secondary | ICD-10-CM | POA: Diagnosis not present

## 2016-10-29 DIAGNOSIS — L97222 Non-pressure chronic ulcer of left calf with fat layer exposed: Secondary | ICD-10-CM | POA: Diagnosis not present

## 2016-10-29 DIAGNOSIS — L97812 Non-pressure chronic ulcer of other part of right lower leg with fat layer exposed: Secondary | ICD-10-CM | POA: Diagnosis not present

## 2016-10-29 DIAGNOSIS — L97212 Non-pressure chronic ulcer of right calf with fat layer exposed: Secondary | ICD-10-CM | POA: Diagnosis not present

## 2016-10-29 DIAGNOSIS — I872 Venous insufficiency (chronic) (peripheral): Secondary | ICD-10-CM | POA: Diagnosis not present

## 2016-10-29 DIAGNOSIS — I87313 Chronic venous hypertension (idiopathic) with ulcer of bilateral lower extremity: Secondary | ICD-10-CM | POA: Diagnosis not present

## 2016-10-29 DIAGNOSIS — I890 Lymphedema, not elsewhere classified: Secondary | ICD-10-CM | POA: Diagnosis not present

## 2016-10-29 DIAGNOSIS — L97822 Non-pressure chronic ulcer of other part of left lower leg with fat layer exposed: Secondary | ICD-10-CM | POA: Diagnosis not present

## 2016-11-03 DIAGNOSIS — L97811 Non-pressure chronic ulcer of other part of right lower leg limited to breakdown of skin: Secondary | ICD-10-CM | POA: Diagnosis not present

## 2016-11-03 DIAGNOSIS — L03116 Cellulitis of left lower limb: Secondary | ICD-10-CM | POA: Diagnosis not present

## 2016-11-03 DIAGNOSIS — L03115 Cellulitis of right lower limb: Secondary | ICD-10-CM | POA: Diagnosis not present

## 2016-11-03 DIAGNOSIS — L97221 Non-pressure chronic ulcer of left calf limited to breakdown of skin: Secondary | ICD-10-CM | POA: Diagnosis not present

## 2016-11-03 DIAGNOSIS — I872 Venous insufficiency (chronic) (peripheral): Secondary | ICD-10-CM | POA: Diagnosis not present

## 2016-11-03 DIAGNOSIS — L97211 Non-pressure chronic ulcer of right calf limited to breakdown of skin: Secondary | ICD-10-CM | POA: Diagnosis not present

## 2016-11-05 DIAGNOSIS — L97811 Non-pressure chronic ulcer of other part of right lower leg limited to breakdown of skin: Secondary | ICD-10-CM | POA: Diagnosis not present

## 2016-11-05 DIAGNOSIS — L97812 Non-pressure chronic ulcer of other part of right lower leg with fat layer exposed: Secondary | ICD-10-CM | POA: Diagnosis not present

## 2016-11-05 DIAGNOSIS — L97222 Non-pressure chronic ulcer of left calf with fat layer exposed: Secondary | ICD-10-CM | POA: Diagnosis not present

## 2016-11-05 DIAGNOSIS — L97821 Non-pressure chronic ulcer of other part of left lower leg limited to breakdown of skin: Secondary | ICD-10-CM | POA: Diagnosis not present

## 2016-11-05 DIAGNOSIS — I872 Venous insufficiency (chronic) (peripheral): Secondary | ICD-10-CM | POA: Diagnosis not present

## 2016-11-05 DIAGNOSIS — I87313 Chronic venous hypertension (idiopathic) with ulcer of bilateral lower extremity: Secondary | ICD-10-CM | POA: Diagnosis not present

## 2016-11-05 DIAGNOSIS — L97212 Non-pressure chronic ulcer of right calf with fat layer exposed: Secondary | ICD-10-CM | POA: Diagnosis not present

## 2016-11-06 DIAGNOSIS — L97221 Non-pressure chronic ulcer of left calf limited to breakdown of skin: Secondary | ICD-10-CM | POA: Diagnosis not present

## 2016-11-06 DIAGNOSIS — L03115 Cellulitis of right lower limb: Secondary | ICD-10-CM | POA: Diagnosis not present

## 2016-11-06 DIAGNOSIS — I872 Venous insufficiency (chronic) (peripheral): Secondary | ICD-10-CM | POA: Diagnosis not present

## 2016-11-06 DIAGNOSIS — L97211 Non-pressure chronic ulcer of right calf limited to breakdown of skin: Secondary | ICD-10-CM | POA: Diagnosis not present

## 2016-11-06 DIAGNOSIS — L03116 Cellulitis of left lower limb: Secondary | ICD-10-CM | POA: Diagnosis not present

## 2016-11-06 DIAGNOSIS — L97811 Non-pressure chronic ulcer of other part of right lower leg limited to breakdown of skin: Secondary | ICD-10-CM | POA: Diagnosis not present

## 2016-11-09 DIAGNOSIS — M545 Low back pain: Secondary | ICD-10-CM | POA: Diagnosis not present

## 2016-11-09 DIAGNOSIS — I872 Venous insufficiency (chronic) (peripheral): Secondary | ICD-10-CM | POA: Diagnosis not present

## 2016-11-09 DIAGNOSIS — G8929 Other chronic pain: Secondary | ICD-10-CM | POA: Diagnosis not present

## 2016-11-09 DIAGNOSIS — L97221 Non-pressure chronic ulcer of left calf limited to breakdown of skin: Secondary | ICD-10-CM | POA: Diagnosis not present

## 2016-11-09 DIAGNOSIS — L97211 Non-pressure chronic ulcer of right calf limited to breakdown of skin: Secondary | ICD-10-CM | POA: Diagnosis not present

## 2016-11-09 DIAGNOSIS — M25551 Pain in right hip: Secondary | ICD-10-CM | POA: Diagnosis not present

## 2016-11-09 DIAGNOSIS — Z9181 History of falling: Secondary | ICD-10-CM | POA: Diagnosis not present

## 2016-11-09 DIAGNOSIS — Z6841 Body Mass Index (BMI) 40.0 and over, adult: Secondary | ICD-10-CM | POA: Diagnosis not present

## 2016-11-09 DIAGNOSIS — L97811 Non-pressure chronic ulcer of other part of right lower leg limited to breakdown of skin: Secondary | ICD-10-CM | POA: Diagnosis not present

## 2016-11-09 DIAGNOSIS — I10 Essential (primary) hypertension: Secondary | ICD-10-CM | POA: Diagnosis not present

## 2016-11-09 DIAGNOSIS — M25552 Pain in left hip: Secondary | ICD-10-CM | POA: Diagnosis not present

## 2016-11-09 DIAGNOSIS — Z7982 Long term (current) use of aspirin: Secondary | ICD-10-CM | POA: Diagnosis not present

## 2016-11-09 DIAGNOSIS — M170 Bilateral primary osteoarthritis of knee: Secondary | ICD-10-CM | POA: Diagnosis not present

## 2016-11-10 DIAGNOSIS — L97221 Non-pressure chronic ulcer of left calf limited to breakdown of skin: Secondary | ICD-10-CM | POA: Diagnosis not present

## 2016-11-10 DIAGNOSIS — M170 Bilateral primary osteoarthritis of knee: Secondary | ICD-10-CM | POA: Diagnosis not present

## 2016-11-10 DIAGNOSIS — L97811 Non-pressure chronic ulcer of other part of right lower leg limited to breakdown of skin: Secondary | ICD-10-CM | POA: Diagnosis not present

## 2016-11-10 DIAGNOSIS — I872 Venous insufficiency (chronic) (peripheral): Secondary | ICD-10-CM | POA: Diagnosis not present

## 2016-11-10 DIAGNOSIS — L97211 Non-pressure chronic ulcer of right calf limited to breakdown of skin: Secondary | ICD-10-CM | POA: Diagnosis not present

## 2016-11-10 DIAGNOSIS — M545 Low back pain: Secondary | ICD-10-CM | POA: Diagnosis not present

## 2016-11-16 DIAGNOSIS — L97221 Non-pressure chronic ulcer of left calf limited to breakdown of skin: Secondary | ICD-10-CM | POA: Diagnosis not present

## 2016-11-16 DIAGNOSIS — L97811 Non-pressure chronic ulcer of other part of right lower leg limited to breakdown of skin: Secondary | ICD-10-CM | POA: Diagnosis not present

## 2016-11-16 DIAGNOSIS — I872 Venous insufficiency (chronic) (peripheral): Secondary | ICD-10-CM | POA: Diagnosis not present

## 2016-11-16 DIAGNOSIS — M545 Low back pain: Secondary | ICD-10-CM | POA: Diagnosis not present

## 2016-11-16 DIAGNOSIS — L97211 Non-pressure chronic ulcer of right calf limited to breakdown of skin: Secondary | ICD-10-CM | POA: Diagnosis not present

## 2016-11-16 DIAGNOSIS — M170 Bilateral primary osteoarthritis of knee: Secondary | ICD-10-CM | POA: Diagnosis not present

## 2016-11-19 DIAGNOSIS — M545 Low back pain: Secondary | ICD-10-CM | POA: Diagnosis not present

## 2016-11-19 DIAGNOSIS — L97811 Non-pressure chronic ulcer of other part of right lower leg limited to breakdown of skin: Secondary | ICD-10-CM | POA: Diagnosis not present

## 2016-11-19 DIAGNOSIS — L97211 Non-pressure chronic ulcer of right calf limited to breakdown of skin: Secondary | ICD-10-CM | POA: Diagnosis not present

## 2016-11-19 DIAGNOSIS — L97221 Non-pressure chronic ulcer of left calf limited to breakdown of skin: Secondary | ICD-10-CM | POA: Diagnosis not present

## 2016-11-19 DIAGNOSIS — M170 Bilateral primary osteoarthritis of knee: Secondary | ICD-10-CM | POA: Diagnosis not present

## 2016-11-19 DIAGNOSIS — L97821 Non-pressure chronic ulcer of other part of left lower leg limited to breakdown of skin: Secondary | ICD-10-CM | POA: Diagnosis not present

## 2016-11-19 DIAGNOSIS — I872 Venous insufficiency (chronic) (peripheral): Secondary | ICD-10-CM | POA: Diagnosis not present

## 2016-11-19 DIAGNOSIS — L97829 Non-pressure chronic ulcer of other part of left lower leg with unspecified severity: Secondary | ICD-10-CM | POA: Diagnosis not present

## 2016-11-24 DIAGNOSIS — Z79899 Other long term (current) drug therapy: Secondary | ICD-10-CM | POA: Diagnosis not present

## 2016-11-24 DIAGNOSIS — I4891 Unspecified atrial fibrillation: Secondary | ICD-10-CM | POA: Diagnosis not present

## 2016-11-24 DIAGNOSIS — J0100 Acute maxillary sinusitis, unspecified: Secondary | ICD-10-CM | POA: Diagnosis not present

## 2016-11-24 DIAGNOSIS — M549 Dorsalgia, unspecified: Secondary | ICD-10-CM | POA: Diagnosis not present

## 2016-11-24 DIAGNOSIS — E349 Endocrine disorder, unspecified: Secondary | ICD-10-CM | POA: Diagnosis not present

## 2016-11-24 DIAGNOSIS — I10 Essential (primary) hypertension: Secondary | ICD-10-CM | POA: Diagnosis not present

## 2016-11-24 DIAGNOSIS — I83009 Varicose veins of unspecified lower extremity with ulcer of unspecified site: Secondary | ICD-10-CM | POA: Diagnosis not present

## 2016-11-26 DIAGNOSIS — L97212 Non-pressure chronic ulcer of right calf with fat layer exposed: Secondary | ICD-10-CM | POA: Diagnosis not present

## 2016-11-26 DIAGNOSIS — L97222 Non-pressure chronic ulcer of left calf with fat layer exposed: Secondary | ICD-10-CM | POA: Diagnosis not present

## 2016-11-26 DIAGNOSIS — I87313 Chronic venous hypertension (idiopathic) with ulcer of bilateral lower extremity: Secondary | ICD-10-CM | POA: Diagnosis not present

## 2016-11-26 DIAGNOSIS — L97812 Non-pressure chronic ulcer of other part of right lower leg with fat layer exposed: Secondary | ICD-10-CM | POA: Diagnosis not present

## 2016-11-26 DIAGNOSIS — I890 Lymphedema, not elsewhere classified: Secondary | ICD-10-CM | POA: Diagnosis not present

## 2016-11-26 DIAGNOSIS — L97811 Non-pressure chronic ulcer of other part of right lower leg limited to breakdown of skin: Secondary | ICD-10-CM | POA: Diagnosis not present

## 2016-11-26 DIAGNOSIS — L97821 Non-pressure chronic ulcer of other part of left lower leg limited to breakdown of skin: Secondary | ICD-10-CM | POA: Diagnosis not present

## 2016-11-26 DIAGNOSIS — I872 Venous insufficiency (chronic) (peripheral): Secondary | ICD-10-CM | POA: Diagnosis not present

## 2016-12-07 DIAGNOSIS — L97522 Non-pressure chronic ulcer of other part of left foot with fat layer exposed: Secondary | ICD-10-CM | POA: Diagnosis not present

## 2016-12-07 DIAGNOSIS — L97512 Non-pressure chronic ulcer of other part of right foot with fat layer exposed: Secondary | ICD-10-CM | POA: Diagnosis not present

## 2016-12-07 DIAGNOSIS — L97529 Non-pressure chronic ulcer of other part of left foot with unspecified severity: Secondary | ICD-10-CM | POA: Diagnosis not present

## 2016-12-07 DIAGNOSIS — I872 Venous insufficiency (chronic) (peripheral): Secondary | ICD-10-CM | POA: Diagnosis not present

## 2016-12-07 DIAGNOSIS — L97519 Non-pressure chronic ulcer of other part of right foot with unspecified severity: Secondary | ICD-10-CM | POA: Diagnosis not present

## 2016-12-17 DIAGNOSIS — S91101A Unspecified open wound of right great toe without damage to nail, initial encounter: Secondary | ICD-10-CM | POA: Diagnosis not present

## 2016-12-17 DIAGNOSIS — L97212 Non-pressure chronic ulcer of right calf with fat layer exposed: Secondary | ICD-10-CM | POA: Diagnosis not present

## 2016-12-17 DIAGNOSIS — L97512 Non-pressure chronic ulcer of other part of right foot with fat layer exposed: Secondary | ICD-10-CM | POA: Diagnosis not present

## 2016-12-17 DIAGNOSIS — I872 Venous insufficiency (chronic) (peripheral): Secondary | ICD-10-CM | POA: Diagnosis not present

## 2016-12-17 DIAGNOSIS — L97222 Non-pressure chronic ulcer of left calf with fat layer exposed: Secondary | ICD-10-CM | POA: Diagnosis not present

## 2016-12-17 DIAGNOSIS — I87313 Chronic venous hypertension (idiopathic) with ulcer of bilateral lower extremity: Secondary | ICD-10-CM | POA: Diagnosis not present

## 2016-12-17 DIAGNOSIS — S91104A Unspecified open wound of right lesser toe(s) without damage to nail, initial encounter: Secondary | ICD-10-CM | POA: Diagnosis not present

## 2016-12-17 DIAGNOSIS — L97812 Non-pressure chronic ulcer of other part of right lower leg with fat layer exposed: Secondary | ICD-10-CM | POA: Diagnosis not present

## 2016-12-17 DIAGNOSIS — L97822 Non-pressure chronic ulcer of other part of left lower leg with fat layer exposed: Secondary | ICD-10-CM | POA: Diagnosis not present

## 2016-12-22 DIAGNOSIS — L97522 Non-pressure chronic ulcer of other part of left foot with fat layer exposed: Secondary | ICD-10-CM | POA: Diagnosis not present

## 2016-12-22 DIAGNOSIS — L97812 Non-pressure chronic ulcer of other part of right lower leg with fat layer exposed: Secondary | ICD-10-CM | POA: Diagnosis not present

## 2016-12-22 DIAGNOSIS — S91102D Unspecified open wound of left great toe without damage to nail, subsequent encounter: Secondary | ICD-10-CM | POA: Diagnosis not present

## 2016-12-22 DIAGNOSIS — L97222 Non-pressure chronic ulcer of left calf with fat layer exposed: Secondary | ICD-10-CM | POA: Diagnosis not present

## 2016-12-22 DIAGNOSIS — I87313 Chronic venous hypertension (idiopathic) with ulcer of bilateral lower extremity: Secondary | ICD-10-CM | POA: Diagnosis not present

## 2016-12-22 DIAGNOSIS — S91101D Unspecified open wound of right great toe without damage to nail, subsequent encounter: Secondary | ICD-10-CM | POA: Diagnosis not present

## 2016-12-22 DIAGNOSIS — L97512 Non-pressure chronic ulcer of other part of right foot with fat layer exposed: Secondary | ICD-10-CM | POA: Diagnosis not present

## 2016-12-22 DIAGNOSIS — S91104D Unspecified open wound of right lesser toe(s) without damage to nail, subsequent encounter: Secondary | ICD-10-CM | POA: Diagnosis not present

## 2016-12-22 DIAGNOSIS — L97212 Non-pressure chronic ulcer of right calf with fat layer exposed: Secondary | ICD-10-CM | POA: Diagnosis not present

## 2016-12-22 DIAGNOSIS — I872 Venous insufficiency (chronic) (peripheral): Secondary | ICD-10-CM | POA: Diagnosis not present

## 2016-12-22 DIAGNOSIS — L97822 Non-pressure chronic ulcer of other part of left lower leg with fat layer exposed: Secondary | ICD-10-CM | POA: Diagnosis not present

## 2016-12-24 DIAGNOSIS — M549 Dorsalgia, unspecified: Secondary | ICD-10-CM | POA: Diagnosis not present

## 2016-12-24 DIAGNOSIS — I10 Essential (primary) hypertension: Secondary | ICD-10-CM | POA: Diagnosis not present

## 2016-12-31 DIAGNOSIS — S91102D Unspecified open wound of left great toe without damage to nail, subsequent encounter: Secondary | ICD-10-CM | POA: Diagnosis not present

## 2016-12-31 DIAGNOSIS — L97822 Non-pressure chronic ulcer of other part of left lower leg with fat layer exposed: Secondary | ICD-10-CM | POA: Diagnosis not present

## 2016-12-31 DIAGNOSIS — S91101D Unspecified open wound of right great toe without damage to nail, subsequent encounter: Secondary | ICD-10-CM | POA: Diagnosis not present

## 2016-12-31 DIAGNOSIS — S91104D Unspecified open wound of right lesser toe(s) without damage to nail, subsequent encounter: Secondary | ICD-10-CM | POA: Diagnosis not present

## 2016-12-31 DIAGNOSIS — I872 Venous insufficiency (chronic) (peripheral): Secondary | ICD-10-CM | POA: Diagnosis not present

## 2016-12-31 DIAGNOSIS — L97222 Non-pressure chronic ulcer of left calf with fat layer exposed: Secondary | ICD-10-CM | POA: Diagnosis not present

## 2016-12-31 DIAGNOSIS — L97522 Non-pressure chronic ulcer of other part of left foot with fat layer exposed: Secondary | ICD-10-CM | POA: Diagnosis not present

## 2016-12-31 DIAGNOSIS — L97812 Non-pressure chronic ulcer of other part of right lower leg with fat layer exposed: Secondary | ICD-10-CM | POA: Diagnosis not present

## 2016-12-31 DIAGNOSIS — L97512 Non-pressure chronic ulcer of other part of right foot with fat layer exposed: Secondary | ICD-10-CM | POA: Diagnosis not present

## 2016-12-31 DIAGNOSIS — L97212 Non-pressure chronic ulcer of right calf with fat layer exposed: Secondary | ICD-10-CM | POA: Diagnosis not present

## 2017-01-11 DIAGNOSIS — R079 Chest pain, unspecified: Secondary | ICD-10-CM | POA: Diagnosis not present

## 2017-01-11 DIAGNOSIS — I509 Heart failure, unspecified: Secondary | ICD-10-CM | POA: Diagnosis not present

## 2017-01-11 DIAGNOSIS — I110 Hypertensive heart disease with heart failure: Secondary | ICD-10-CM | POA: Diagnosis not present

## 2017-01-11 DIAGNOSIS — I517 Cardiomegaly: Secondary | ICD-10-CM | POA: Diagnosis not present

## 2017-01-11 DIAGNOSIS — I10 Essential (primary) hypertension: Secondary | ICD-10-CM | POA: Diagnosis not present

## 2017-01-11 DIAGNOSIS — R0789 Other chest pain: Secondary | ICD-10-CM | POA: Diagnosis not present

## 2017-01-11 DIAGNOSIS — I4891 Unspecified atrial fibrillation: Secondary | ICD-10-CM | POA: Diagnosis not present

## 2017-01-11 DIAGNOSIS — R069 Unspecified abnormalities of breathing: Secondary | ICD-10-CM | POA: Diagnosis not present

## 2017-01-12 DIAGNOSIS — I517 Cardiomegaly: Secondary | ICD-10-CM | POA: Diagnosis not present

## 2017-01-12 DIAGNOSIS — I5023 Acute on chronic systolic (congestive) heart failure: Secondary | ICD-10-CM | POA: Diagnosis not present

## 2017-01-12 DIAGNOSIS — I428 Other cardiomyopathies: Secondary | ICD-10-CM | POA: Diagnosis not present

## 2017-01-12 DIAGNOSIS — I4891 Unspecified atrial fibrillation: Secondary | ICD-10-CM | POA: Diagnosis not present

## 2017-01-12 DIAGNOSIS — I110 Hypertensive heart disease with heart failure: Secondary | ICD-10-CM | POA: Diagnosis not present

## 2017-01-12 DIAGNOSIS — I10 Essential (primary) hypertension: Secondary | ICD-10-CM | POA: Diagnosis not present

## 2017-01-12 DIAGNOSIS — I272 Pulmonary hypertension, unspecified: Secondary | ICD-10-CM | POA: Diagnosis not present

## 2017-01-12 DIAGNOSIS — I509 Heart failure, unspecified: Secondary | ICD-10-CM | POA: Diagnosis not present

## 2017-01-12 DIAGNOSIS — Z8249 Family history of ischemic heart disease and other diseases of the circulatory system: Secondary | ICD-10-CM | POA: Diagnosis not present

## 2017-01-12 DIAGNOSIS — R079 Chest pain, unspecified: Secondary | ICD-10-CM | POA: Diagnosis not present

## 2017-01-12 DIAGNOSIS — I350 Nonrheumatic aortic (valve) stenosis: Secondary | ICD-10-CM | POA: Diagnosis not present

## 2017-01-12 DIAGNOSIS — I482 Chronic atrial fibrillation: Secondary | ICD-10-CM | POA: Diagnosis not present

## 2017-01-13 DIAGNOSIS — I10 Essential (primary) hypertension: Secondary | ICD-10-CM | POA: Diagnosis not present

## 2017-01-13 DIAGNOSIS — I272 Pulmonary hypertension, unspecified: Secondary | ICD-10-CM | POA: Diagnosis present

## 2017-01-13 DIAGNOSIS — I110 Hypertensive heart disease with heart failure: Secondary | ICD-10-CM | POA: Diagnosis not present

## 2017-01-13 DIAGNOSIS — Z9119 Patient's noncompliance with other medical treatment and regimen: Secondary | ICD-10-CM | POA: Diagnosis not present

## 2017-01-13 DIAGNOSIS — Z7901 Long term (current) use of anticoagulants: Secondary | ICD-10-CM | POA: Diagnosis not present

## 2017-01-13 DIAGNOSIS — Z79891 Long term (current) use of opiate analgesic: Secondary | ICD-10-CM | POA: Diagnosis not present

## 2017-01-13 DIAGNOSIS — Z7982 Long term (current) use of aspirin: Secondary | ICD-10-CM | POA: Diagnosis not present

## 2017-01-13 DIAGNOSIS — Z79899 Other long term (current) drug therapy: Secondary | ICD-10-CM | POA: Diagnosis not present

## 2017-01-13 DIAGNOSIS — G894 Chronic pain syndrome: Secondary | ICD-10-CM | POA: Diagnosis not present

## 2017-01-13 DIAGNOSIS — I4891 Unspecified atrial fibrillation: Secondary | ICD-10-CM | POA: Diagnosis not present

## 2017-01-13 DIAGNOSIS — I482 Chronic atrial fibrillation: Secondary | ICD-10-CM | POA: Diagnosis not present

## 2017-01-13 DIAGNOSIS — M549 Dorsalgia, unspecified: Secondary | ICD-10-CM | POA: Diagnosis present

## 2017-01-13 DIAGNOSIS — I5031 Acute diastolic (congestive) heart failure: Secondary | ICD-10-CM | POA: Diagnosis not present

## 2017-01-13 DIAGNOSIS — I5023 Acute on chronic systolic (congestive) heart failure: Secondary | ICD-10-CM | POA: Diagnosis not present

## 2017-01-13 DIAGNOSIS — I420 Dilated cardiomyopathy: Secondary | ICD-10-CM | POA: Diagnosis not present

## 2017-01-13 DIAGNOSIS — Z6841 Body Mass Index (BMI) 40.0 and over, adult: Secondary | ICD-10-CM | POA: Diagnosis not present

## 2017-01-13 DIAGNOSIS — I425 Other restrictive cardiomyopathy: Secondary | ICD-10-CM | POA: Diagnosis not present

## 2017-01-13 DIAGNOSIS — Z713 Dietary counseling and surveillance: Secondary | ICD-10-CM | POA: Diagnosis not present

## 2017-01-13 DIAGNOSIS — R079 Chest pain, unspecified: Secondary | ICD-10-CM | POA: Diagnosis not present

## 2017-01-13 DIAGNOSIS — I509 Heart failure, unspecified: Secondary | ICD-10-CM | POA: Diagnosis not present

## 2017-01-16 DIAGNOSIS — I5031 Acute diastolic (congestive) heart failure: Secondary | ICD-10-CM

## 2017-01-16 DIAGNOSIS — I425 Other restrictive cardiomyopathy: Secondary | ICD-10-CM

## 2017-01-16 DIAGNOSIS — I4891 Unspecified atrial fibrillation: Secondary | ICD-10-CM

## 2017-01-17 DIAGNOSIS — I425 Other restrictive cardiomyopathy: Secondary | ICD-10-CM

## 2017-01-17 DIAGNOSIS — I4891 Unspecified atrial fibrillation: Secondary | ICD-10-CM

## 2017-01-17 DIAGNOSIS — I5031 Acute diastolic (congestive) heart failure: Secondary | ICD-10-CM

## 2017-01-18 DIAGNOSIS — I482 Chronic atrial fibrillation: Secondary | ICD-10-CM

## 2017-01-18 DIAGNOSIS — I5023 Acute on chronic systolic (congestive) heart failure: Secondary | ICD-10-CM

## 2017-01-18 DIAGNOSIS — I10 Essential (primary) hypertension: Secondary | ICD-10-CM

## 2017-01-25 DIAGNOSIS — E876 Hypokalemia: Secondary | ICD-10-CM | POA: Diagnosis not present

## 2017-01-25 DIAGNOSIS — L409 Psoriasis, unspecified: Secondary | ICD-10-CM | POA: Diagnosis not present

## 2017-01-25 DIAGNOSIS — R079 Chest pain, unspecified: Secondary | ICD-10-CM | POA: Diagnosis not present

## 2017-01-25 DIAGNOSIS — Z6841 Body Mass Index (BMI) 40.0 and over, adult: Secondary | ICD-10-CM | POA: Diagnosis not present

## 2017-01-25 DIAGNOSIS — I83009 Varicose veins of unspecified lower extremity with ulcer of unspecified site: Secondary | ICD-10-CM | POA: Diagnosis not present

## 2017-01-25 DIAGNOSIS — I10 Essential (primary) hypertension: Secondary | ICD-10-CM | POA: Diagnosis not present

## 2017-01-25 DIAGNOSIS — Z09 Encounter for follow-up examination after completed treatment for conditions other than malignant neoplasm: Secondary | ICD-10-CM | POA: Diagnosis not present

## 2017-01-25 DIAGNOSIS — L0390 Cellulitis, unspecified: Secondary | ICD-10-CM | POA: Diagnosis not present

## 2017-01-25 DIAGNOSIS — B965 Pseudomonas (aeruginosa) (mallei) (pseudomallei) as the cause of diseases classified elsewhere: Secondary | ICD-10-CM | POA: Diagnosis not present

## 2017-01-25 DIAGNOSIS — Z79899 Other long term (current) drug therapy: Secondary | ICD-10-CM | POA: Diagnosis not present

## 2017-01-25 DIAGNOSIS — D649 Anemia, unspecified: Secondary | ICD-10-CM | POA: Diagnosis not present

## 2017-01-25 DIAGNOSIS — G4733 Obstructive sleep apnea (adult) (pediatric): Secondary | ICD-10-CM | POA: Diagnosis not present

## 2017-01-25 DIAGNOSIS — I4891 Unspecified atrial fibrillation: Secondary | ICD-10-CM | POA: Diagnosis not present

## 2017-01-25 DIAGNOSIS — Z7901 Long term (current) use of anticoagulants: Secondary | ICD-10-CM | POA: Diagnosis not present

## 2017-01-25 DIAGNOSIS — I509 Heart failure, unspecified: Secondary | ICD-10-CM | POA: Diagnosis not present

## 2017-01-28 DIAGNOSIS — L97811 Non-pressure chronic ulcer of other part of right lower leg limited to breakdown of skin: Secondary | ICD-10-CM | POA: Diagnosis not present

## 2017-01-28 DIAGNOSIS — L97221 Non-pressure chronic ulcer of left calf limited to breakdown of skin: Secondary | ICD-10-CM | POA: Diagnosis not present

## 2017-01-28 DIAGNOSIS — L97211 Non-pressure chronic ulcer of right calf limited to breakdown of skin: Secondary | ICD-10-CM | POA: Diagnosis not present

## 2017-01-28 DIAGNOSIS — L97529 Non-pressure chronic ulcer of other part of left foot with unspecified severity: Secondary | ICD-10-CM | POA: Diagnosis not present

## 2017-01-28 DIAGNOSIS — L97821 Non-pressure chronic ulcer of other part of left lower leg limited to breakdown of skin: Secondary | ICD-10-CM | POA: Diagnosis not present

## 2017-01-28 DIAGNOSIS — I872 Venous insufficiency (chronic) (peripheral): Secondary | ICD-10-CM | POA: Diagnosis not present

## 2017-01-28 DIAGNOSIS — L97522 Non-pressure chronic ulcer of other part of left foot with fat layer exposed: Secondary | ICD-10-CM | POA: Diagnosis not present

## 2017-02-04 DIAGNOSIS — S91102D Unspecified open wound of left great toe without damage to nail, subsequent encounter: Secondary | ICD-10-CM | POA: Diagnosis not present

## 2017-02-04 DIAGNOSIS — I872 Venous insufficiency (chronic) (peripheral): Secondary | ICD-10-CM | POA: Diagnosis not present

## 2017-02-04 DIAGNOSIS — L97522 Non-pressure chronic ulcer of other part of left foot with fat layer exposed: Secondary | ICD-10-CM | POA: Diagnosis not present

## 2017-02-04 DIAGNOSIS — M545 Low back pain, unspecified: Secondary | ICD-10-CM

## 2017-02-04 DIAGNOSIS — L97812 Non-pressure chronic ulcer of other part of right lower leg with fat layer exposed: Secondary | ICD-10-CM | POA: Diagnosis not present

## 2017-02-04 DIAGNOSIS — L03116 Cellulitis of left lower limb: Secondary | ICD-10-CM | POA: Diagnosis not present

## 2017-02-04 DIAGNOSIS — L89892 Pressure ulcer of other site, stage 2: Secondary | ICD-10-CM | POA: Diagnosis not present

## 2017-02-04 DIAGNOSIS — L97222 Non-pressure chronic ulcer of left calf with fat layer exposed: Secondary | ICD-10-CM | POA: Diagnosis not present

## 2017-02-04 DIAGNOSIS — L97822 Non-pressure chronic ulcer of other part of left lower leg with fat layer exposed: Secondary | ICD-10-CM | POA: Diagnosis not present

## 2017-02-04 DIAGNOSIS — G894 Chronic pain syndrome: Secondary | ICD-10-CM

## 2017-02-04 DIAGNOSIS — L97212 Non-pressure chronic ulcer of right calf with fat layer exposed: Secondary | ICD-10-CM | POA: Diagnosis not present

## 2017-02-04 HISTORY — DX: Low back pain, unspecified: M54.50

## 2017-02-04 HISTORY — DX: Morbid (severe) obesity due to excess calories: E66.01

## 2017-02-04 HISTORY — DX: Chronic pain syndrome: G89.4

## 2017-02-18 DIAGNOSIS — S91102A Unspecified open wound of left great toe without damage to nail, initial encounter: Secondary | ICD-10-CM | POA: Diagnosis not present

## 2017-02-18 DIAGNOSIS — S91112A Laceration without foreign body of left great toe without damage to nail, initial encounter: Secondary | ICD-10-CM | POA: Diagnosis not present

## 2017-02-18 DIAGNOSIS — L97822 Non-pressure chronic ulcer of other part of left lower leg with fat layer exposed: Secondary | ICD-10-CM | POA: Diagnosis not present

## 2017-02-18 DIAGNOSIS — L97212 Non-pressure chronic ulcer of right calf with fat layer exposed: Secondary | ICD-10-CM | POA: Diagnosis not present

## 2017-02-18 DIAGNOSIS — L97812 Non-pressure chronic ulcer of other part of right lower leg with fat layer exposed: Secondary | ICD-10-CM | POA: Diagnosis not present

## 2017-02-18 DIAGNOSIS — L97222 Non-pressure chronic ulcer of left calf with fat layer exposed: Secondary | ICD-10-CM | POA: Diagnosis not present

## 2017-02-18 DIAGNOSIS — I872 Venous insufficiency (chronic) (peripheral): Secondary | ICD-10-CM | POA: Diagnosis not present

## 2017-02-23 ENCOUNTER — Ambulatory Visit (INDEPENDENT_AMBULATORY_CARE_PROVIDER_SITE_OTHER): Payer: Medicare Other | Admitting: Cardiology

## 2017-02-23 ENCOUNTER — Encounter: Payer: Self-pay | Admitting: Cardiology

## 2017-02-23 VITALS — BP 112/68 | HR 100 | Ht 70.0 in | Wt 317.4 lb

## 2017-02-23 DIAGNOSIS — I4819 Other persistent atrial fibrillation: Secondary | ICD-10-CM

## 2017-02-23 DIAGNOSIS — I481 Persistent atrial fibrillation: Secondary | ICD-10-CM

## 2017-02-23 DIAGNOSIS — I5022 Chronic systolic (congestive) heart failure: Secondary | ICD-10-CM | POA: Diagnosis not present

## 2017-02-23 DIAGNOSIS — G4733 Obstructive sleep apnea (adult) (pediatric): Secondary | ICD-10-CM

## 2017-02-23 DIAGNOSIS — I517 Cardiomegaly: Secondary | ICD-10-CM | POA: Diagnosis not present

## 2017-02-23 DIAGNOSIS — I110 Hypertensive heart disease with heart failure: Secondary | ICD-10-CM

## 2017-02-23 NOTE — Progress Notes (Signed)
Cardiology Office Note:    Date:  02/24/2017   ID:  Tyler Mckinney, DOB [birth year], MRN [id number]  PCP:  Lonie Peak, PA-C  Cardiologist:  Norman Herrlich, MD    Referring MD: No ref. provider found    ASSESSMENT:    1. Chronic systolic heart failure (HCC)   2. Persistent atrial fibrillation (HCC)   3. LVH (left ventricular hypertrophy)   4. OSA (obstructive sleep apnea)   5. Morbid obesity (HCC)   6. Hypertensive heart failure (HCC)    PLAN:    In order of problems listed above:  1. Improved compensated minimally volume overloaded. Continue his current diuretic as he continues to lose weight guideline direct medical treatment including beta blocker ARB and recheck echocardiogram at the time of his next visit if EF remains less than 35% consider ICD stable rate controlled continue beta blocker anticoagulant stable 2. Stable rate controlled continue anticoagulant beta blocker and with long-standing atrial fibrillation and severe biatrial enlargement I will not refer him for any procedures to try to resume sinus rhythm 3. Secondary to long-standing poorly controlled hypertension 4. Continue CPAP 5. Continue weight loss 6. BP is controlled he reduces Catapres dosage to 0.3 at bedtime and then next visit we'll consider transition to ARNI if ejection fraction remains severely reduced and BNP level elevated and symptomatic.   Next appointment: 2 months  Medication Adjustments/Labs and Tests Ordered: Current medicines are reviewed at length with the patient today.  Concerns regarding medicines are outlined above.  Orders Placed This Encounter  Procedures  . B Nat Peptide  . Basic metabolic panel   Meds ordered this encounter  Medications  . cloNIDine (CATAPRES) 0.3 MG tablet    Sig: Take 1 tablet (0.3 mg total) by mouth at bedtime.    Dispense:  90 tablet    Refill:  3  . apixaban (ELIQUIS) 5 MG TABS tablet    Sig: Take 1 tablet (5 mg total) by mouth 2 (two) times daily.   Dispense:  60 tablet    Refill:  11  . DISCONTD: carvedilol (COREG) 25 MG tablet    Sig: Take 1 tablet (25 mg total) by mouth 2 (two) times daily.    Dispense:  180 tablet    Refill:  3    Chief Complaint  Patient presents with  . Hospitalization Follow-up    per RH to evaluate CP, CHF, and AFib  . Atrial Fibrillation  . Chest Pain    History of Present Illness:    Tyler Mckinney is a 61 y.o. male with a hx of persistent AF sice April 2015 without an attempt to resume SRTH, mildly decreased EF with severe LAE, hypertension, obesity, sleep apnea last seen 2 years ago. Admitted to [facility] 01/11/17 with chest pain, SOB and uncontrolled HTN. He did not have ACS, BNP was severely elevated 8640 and found to be in decompensated heart failure with severe cardiomyopathy EF now 30-35%.  His discharge from the hospital he is taking good care of himself sodium restricted and his weight is down 34 pounds. He is pleased with the quality is life has no edema shortness of breath orthopnea or palpitation PND is committed to ongoing medical treatment and compliant with medications Compliance with diet, lifestyle and medications: Yes Past Medical History:  Diagnosis Date  . Allergy   . Arthritis   . Atrial fibrillation (HCC) 08/21/2015  . CHF (congestive heart failure) (HCC)   . Chronic pain syndrome 02/04/2017  . Degenerative disc disease,  lumbar 01/29/2013  . HTN (hypertension) 08/21/2015  . Hypertension   . Insomnia 08/21/2015  . Low back pain 02/04/2017  . Morbid obesity (HCC) 02/04/2017  . OSA (obstructive sleep apnea) 08/21/2015  . Pain in knee joint 11/30/2012  . Primary osteoarthritis of both knees 12/28/2012  . Sleep apnea     Past Surgical History:  Procedure Laterality Date  . FRACTURE SURGERY    . HERNIA REPAIR    . LEG SURGERY Left    wound debridement    Current Medications: Current Meds  Medication Sig  . acetaminophen (TYLENOL) 500 MG tablet Take 500 mg by mouth as needed.  Marland Kitchen apixaban  (ELIQUIS) 5 MG TABS tablet Take 1 tablet (5 mg total) by mouth 2 (two) times daily.  . Ascorbic Acid (VITAMIN C) 1000 MG tablet Take 1,000 mg by mouth daily.  Marland Kitchen aspirin 325 MG tablet Take 325 mg by mouth daily.  . carvedilol (COREG) 12.5 MG tablet Take 25 mg by mouth 2 (two) times daily with a meal.  . celecoxib (CELEBREX) 200 MG capsule Take 200 mg by mouth 2 (two) times daily.  . cloNIDine (CATAPRES) 0.3 MG tablet Take 1 tablet (0.3 mg total) by mouth at bedtime.  . DULoxetine (CYMBALTA) 60 MG capsule Take 60 mg by mouth daily.  . furosemide (LASIX) 40 MG tablet Take 60 mg by mouth daily.  Marland Kitchen losartan (COZAAR) 50 MG tablet Take 50 mg by mouth daily.  . potassium chloride (K-DUR,KLOR-CON) 10 MEQ tablet Take 10 mEq by mouth daily.  Marland Kitchen testosterone cypionate (DEPOTESTOSTERONE CYPIONATE) 200 MG/ML injection Inject into the muscle every 14 (fourteen) days.  Marland Kitchen tiZANidine (ZANAFLEX) 4 MG tablet Take 6 mg by mouth 3 (three) times daily as needed for pain.     Allergies:   Penicillins; Tetanus toxoids; Hydralazine; and Isosorbide nitrate   Social History   Social History  . Marital status: Unknown    Spouse name: N/A  . Number of children: N/A  . Years of education: N/A   Social History Main Topics  . Smoking status: Never Smoker  . Smokeless tobacco: Never Used  . Alcohol use No  . Drug use: No  . Sexual activity: Not Asked   Other Topics Concern  . None   Social History Narrative  . None     Family History: The patient's family history includes Arthritis in his father and mother; Cancer in his mother; Diabetes in his father; Miscarriages / [place] in his mother. ROS:   Please see the history of present illness.    All other systems reviewed and are negative.  EKGs/Labs/Other Studies Reviewed:    The following studies were reviewed today:   Echo Mach 2016: an echocardiogram in our office in March showed borderline ejection fraction of about 45-55% with marked concentric  hypertrophy, markedly dilated left atrium, mild mitral regurgitation. RV size and function reportedly normal with only mild TR and no evidence of significant pulmonary hypertension  Echo 01/12/17 EF 30-35% Severe RV dialtion and dysfunction Severe LAE and RAE Mild AS Severe PAH Mild to moderate MR Moderate TR    Recent Labs: No results found for requested labs within last 8760 hours.  Recent Lipid Panel No results found for: CHOL, TRIG, HDL, CHOLHDL, VLDL, LDLCALC, LDLDIRECT  Physical Exam:    VS:  BP 112/68   Pulse 100   Ht 5\' 10"  (1.778 m)   Wt (!) 317 lb 6.4 oz (144 kg)   SpO2 97%   BMI 45.54  kg/m     Wt Readings from Last 3 Encounters:  02/23/17 (!) 317 lb 6.4 oz (144 kg)     GEN:  Well nourished, well developed in no acute distress HEENT: Normal NECK: No JVD; No carotid bruits LYMPHATICS: No lymphadenopathy CARDIAC: Irregular irregular variable first heart sound soft S3  no murmurs, rubs, gallops RESPIRATORY:  Clear to auscultation without rales, wheezing or rhonchi  ABDOMEN: Soft, non-tender, non-distended MUSCULOSKELETAL: 1-2+ edema; No deformity  SKIN: Warm and dry NEUROLOGIC:  Alert and oriented x 3 PSYCHIATRIC:  Normal affect    Signed, Norman Herrlich, MD  02/24/2017 2:38 PM    [facility]

## 2017-02-24 DIAGNOSIS — I5022 Chronic systolic (congestive) heart failure: Secondary | ICD-10-CM | POA: Insufficient documentation

## 2017-02-24 MED ORDER — APIXABAN 5 MG PO TABS: 5.0000 mg | ORAL_TABLET | Freq: Two times a day (BID) | ORAL | 11 refills | Status: DC

## 2017-02-24 MED ORDER — CARVEDILOL 25 MG PO TABS: 25.0000 mg | ORAL_TABLET | Freq: Two times a day (BID) | ORAL | 3 refills | Status: DC

## 2017-02-24 MED ORDER — CLONIDINE HCL 0.3 MG PO TABS: 0.3000 mg | ORAL_TABLET | Freq: Every day | ORAL | 3 refills | Status: DC

## 2017-02-24 NOTE — Patient Instructions (Addendum)
Medication Instructions:  Your physician has recommended you make the following change in your medication:  CHANGE clonidine 0.3 mg daily at bedtime RESUME carvedilol (Coreg) 25 mg twice daily RESUME apixaban (Eliquis) 5 mg twice daily.  Labwork: You will need to go to [facility] office for lab work. BMP, BNP. I have faxed the order for lab work to their office.  Testing/Procedures: None  Follow-Up: Your physician recommends that you schedule a follow-up appointment in: 2 months.   Any Other Special Instructions Will Be Listed Below (If Applicable).     If you need a refill on your cardiac medications before your next appointment, please call your pharmacy.

## 2017-02-28 DIAGNOSIS — I4891 Unspecified atrial fibrillation: Secondary | ICD-10-CM | POA: Diagnosis not present

## 2017-02-28 DIAGNOSIS — E349 Endocrine disorder, unspecified: Secondary | ICD-10-CM | POA: Diagnosis not present

## 2017-02-28 DIAGNOSIS — M5416 Radiculopathy, lumbar region: Secondary | ICD-10-CM | POA: Diagnosis not present

## 2017-02-28 DIAGNOSIS — I83009 Varicose veins of unspecified lower extremity with ulcer of unspecified site: Secondary | ICD-10-CM | POA: Diagnosis not present

## 2017-02-28 DIAGNOSIS — Z6841 Body Mass Index (BMI) 40.0 and over, adult: Secondary | ICD-10-CM | POA: Diagnosis not present

## 2017-02-28 DIAGNOSIS — M549 Dorsalgia, unspecified: Secondary | ICD-10-CM | POA: Diagnosis not present

## 2017-02-28 DIAGNOSIS — I509 Heart failure, unspecified: Secondary | ICD-10-CM | POA: Diagnosis not present

## 2017-02-28 DIAGNOSIS — I10 Essential (primary) hypertension: Secondary | ICD-10-CM | POA: Diagnosis not present

## 2017-03-04 DIAGNOSIS — G629 Polyneuropathy, unspecified: Secondary | ICD-10-CM | POA: Diagnosis not present

## 2017-03-04 DIAGNOSIS — L97812 Non-pressure chronic ulcer of other part of right lower leg with fat layer exposed: Secondary | ICD-10-CM | POA: Diagnosis not present

## 2017-03-04 DIAGNOSIS — L97212 Non-pressure chronic ulcer of right calf with fat layer exposed: Secondary | ICD-10-CM | POA: Diagnosis not present

## 2017-03-04 DIAGNOSIS — I872 Venous insufficiency (chronic) (peripheral): Secondary | ICD-10-CM | POA: Diagnosis not present

## 2017-03-04 DIAGNOSIS — L97529 Non-pressure chronic ulcer of other part of left foot with unspecified severity: Secondary | ICD-10-CM | POA: Diagnosis not present

## 2017-03-04 DIAGNOSIS — L97822 Non-pressure chronic ulcer of other part of left lower leg with fat layer exposed: Secondary | ICD-10-CM | POA: Diagnosis not present

## 2017-03-04 DIAGNOSIS — L97522 Non-pressure chronic ulcer of other part of left foot with fat layer exposed: Secondary | ICD-10-CM | POA: Diagnosis not present

## 2017-03-04 DIAGNOSIS — L97222 Non-pressure chronic ulcer of left calf with fat layer exposed: Secondary | ICD-10-CM | POA: Diagnosis not present

## 2017-03-18 DIAGNOSIS — I87313 Chronic venous hypertension (idiopathic) with ulcer of bilateral lower extremity: Secondary | ICD-10-CM | POA: Diagnosis not present

## 2017-03-18 DIAGNOSIS — S91115A Laceration without foreign body of left lesser toe(s) without damage to nail, initial encounter: Secondary | ICD-10-CM | POA: Diagnosis not present

## 2017-03-18 DIAGNOSIS — I872 Venous insufficiency (chronic) (peripheral): Secondary | ICD-10-CM | POA: Diagnosis not present

## 2017-03-18 DIAGNOSIS — L97522 Non-pressure chronic ulcer of other part of left foot with fat layer exposed: Secondary | ICD-10-CM | POA: Diagnosis not present

## 2017-03-18 DIAGNOSIS — L97222 Non-pressure chronic ulcer of left calf with fat layer exposed: Secondary | ICD-10-CM | POA: Diagnosis not present

## 2017-03-18 DIAGNOSIS — L97512 Non-pressure chronic ulcer of other part of right foot with fat layer exposed: Secondary | ICD-10-CM | POA: Diagnosis not present

## 2017-03-18 DIAGNOSIS — L97212 Non-pressure chronic ulcer of right calf with fat layer exposed: Secondary | ICD-10-CM | POA: Diagnosis not present

## 2017-03-18 DIAGNOSIS — L97812 Non-pressure chronic ulcer of other part of right lower leg with fat layer exposed: Secondary | ICD-10-CM | POA: Diagnosis not present

## 2017-03-18 DIAGNOSIS — L97822 Non-pressure chronic ulcer of other part of left lower leg with fat layer exposed: Secondary | ICD-10-CM | POA: Diagnosis not present

## 2017-03-18 DIAGNOSIS — G629 Polyneuropathy, unspecified: Secondary | ICD-10-CM | POA: Diagnosis not present

## 2017-04-08 DIAGNOSIS — L97212 Non-pressure chronic ulcer of right calf with fat layer exposed: Secondary | ICD-10-CM | POA: Diagnosis not present

## 2017-04-08 DIAGNOSIS — L97222 Non-pressure chronic ulcer of left calf with fat layer exposed: Secondary | ICD-10-CM | POA: Diagnosis not present

## 2017-04-08 DIAGNOSIS — L97812 Non-pressure chronic ulcer of other part of right lower leg with fat layer exposed: Secondary | ICD-10-CM | POA: Diagnosis not present

## 2017-04-08 DIAGNOSIS — L97822 Non-pressure chronic ulcer of other part of left lower leg with fat layer exposed: Secondary | ICD-10-CM | POA: Diagnosis not present

## 2017-04-08 DIAGNOSIS — I87313 Chronic venous hypertension (idiopathic) with ulcer of bilateral lower extremity: Secondary | ICD-10-CM | POA: Diagnosis not present

## 2017-04-08 DIAGNOSIS — I872 Venous insufficiency (chronic) (peripheral): Secondary | ICD-10-CM | POA: Diagnosis not present

## 2017-04-26 DIAGNOSIS — L97222 Non-pressure chronic ulcer of left calf with fat layer exposed: Secondary | ICD-10-CM | POA: Diagnosis not present

## 2017-04-26 DIAGNOSIS — L97212 Non-pressure chronic ulcer of right calf with fat layer exposed: Secondary | ICD-10-CM | POA: Diagnosis not present

## 2017-04-26 DIAGNOSIS — L97812 Non-pressure chronic ulcer of other part of right lower leg with fat layer exposed: Secondary | ICD-10-CM | POA: Diagnosis not present

## 2017-04-26 DIAGNOSIS — L97822 Non-pressure chronic ulcer of other part of left lower leg with fat layer exposed: Secondary | ICD-10-CM | POA: Diagnosis not present

## 2017-04-26 DIAGNOSIS — I872 Venous insufficiency (chronic) (peripheral): Secondary | ICD-10-CM | POA: Diagnosis not present

## 2017-04-26 DIAGNOSIS — I87313 Chronic venous hypertension (idiopathic) with ulcer of bilateral lower extremity: Secondary | ICD-10-CM | POA: Diagnosis not present

## 2017-05-09 DIAGNOSIS — L97822 Non-pressure chronic ulcer of other part of left lower leg with fat layer exposed: Secondary | ICD-10-CM | POA: Diagnosis not present

## 2017-05-09 DIAGNOSIS — L97222 Non-pressure chronic ulcer of left calf with fat layer exposed: Secondary | ICD-10-CM | POA: Diagnosis not present

## 2017-05-09 DIAGNOSIS — I87313 Chronic venous hypertension (idiopathic) with ulcer of bilateral lower extremity: Secondary | ICD-10-CM | POA: Diagnosis not present

## 2017-05-09 DIAGNOSIS — L97212 Non-pressure chronic ulcer of right calf with fat layer exposed: Secondary | ICD-10-CM | POA: Diagnosis not present

## 2017-05-09 DIAGNOSIS — I872 Venous insufficiency (chronic) (peripheral): Secondary | ICD-10-CM | POA: Diagnosis not present

## 2017-05-09 DIAGNOSIS — L97812 Non-pressure chronic ulcer of other part of right lower leg with fat layer exposed: Secondary | ICD-10-CM | POA: Diagnosis not present

## 2017-05-19 DIAGNOSIS — I83009 Varicose veins of unspecified lower extremity with ulcer of unspecified site: Secondary | ICD-10-CM | POA: Diagnosis not present

## 2017-05-19 DIAGNOSIS — M549 Dorsalgia, unspecified: Secondary | ICD-10-CM | POA: Diagnosis not present

## 2017-05-19 DIAGNOSIS — M5416 Radiculopathy, lumbar region: Secondary | ICD-10-CM | POA: Diagnosis not present

## 2017-05-19 DIAGNOSIS — L4050 Arthropathic psoriasis, unspecified: Secondary | ICD-10-CM | POA: Diagnosis not present

## 2017-05-19 DIAGNOSIS — G629 Polyneuropathy, unspecified: Secondary | ICD-10-CM | POA: Diagnosis not present

## 2017-05-19 DIAGNOSIS — I10 Essential (primary) hypertension: Secondary | ICD-10-CM | POA: Diagnosis not present

## 2017-05-19 DIAGNOSIS — Z6841 Body Mass Index (BMI) 40.0 and over, adult: Secondary | ICD-10-CM | POA: Diagnosis not present

## 2017-05-19 DIAGNOSIS — I4891 Unspecified atrial fibrillation: Secondary | ICD-10-CM | POA: Diagnosis not present

## 2017-05-20 DIAGNOSIS — L97812 Non-pressure chronic ulcer of other part of right lower leg with fat layer exposed: Secondary | ICD-10-CM | POA: Diagnosis not present

## 2017-05-20 DIAGNOSIS — L97822 Non-pressure chronic ulcer of other part of left lower leg with fat layer exposed: Secondary | ICD-10-CM | POA: Diagnosis not present

## 2017-05-20 DIAGNOSIS — I87313 Chronic venous hypertension (idiopathic) with ulcer of bilateral lower extremity: Secondary | ICD-10-CM | POA: Diagnosis not present

## 2017-06-01 DIAGNOSIS — L03116 Cellulitis of left lower limb: Secondary | ICD-10-CM | POA: Diagnosis not present

## 2017-06-01 DIAGNOSIS — I5022 Chronic systolic (congestive) heart failure: Secondary | ICD-10-CM | POA: Diagnosis not present

## 2017-06-01 DIAGNOSIS — I959 Hypotension, unspecified: Secondary | ICD-10-CM | POA: Diagnosis not present

## 2017-06-01 DIAGNOSIS — M791 Myalgia, unspecified site: Secondary | ICD-10-CM | POA: Diagnosis present

## 2017-06-01 DIAGNOSIS — J449 Chronic obstructive pulmonary disease, unspecified: Secondary | ICD-10-CM | POA: Diagnosis present

## 2017-06-01 DIAGNOSIS — E876 Hypokalemia: Secondary | ICD-10-CM | POA: Diagnosis not present

## 2017-06-01 DIAGNOSIS — R079 Chest pain, unspecified: Secondary | ICD-10-CM | POA: Diagnosis not present

## 2017-06-01 DIAGNOSIS — M5442 Lumbago with sciatica, left side: Secondary | ICD-10-CM | POA: Diagnosis not present

## 2017-06-01 DIAGNOSIS — D62 Acute posthemorrhagic anemia: Secondary | ICD-10-CM | POA: Diagnosis not present

## 2017-06-01 DIAGNOSIS — I482 Chronic atrial fibrillation: Secondary | ICD-10-CM | POA: Diagnosis present

## 2017-06-01 DIAGNOSIS — A419 Sepsis, unspecified organism: Secondary | ICD-10-CM | POA: Diagnosis not present

## 2017-06-01 DIAGNOSIS — R0789 Other chest pain: Secondary | ICD-10-CM | POA: Diagnosis present

## 2017-06-01 DIAGNOSIS — I4891 Unspecified atrial fibrillation: Secondary | ICD-10-CM | POA: Diagnosis not present

## 2017-06-01 DIAGNOSIS — R031 Nonspecific low blood-pressure reading: Secondary | ICD-10-CM | POA: Diagnosis not present

## 2017-06-01 DIAGNOSIS — I110 Hypertensive heart disease with heart failure: Secondary | ICD-10-CM | POA: Diagnosis present

## 2017-06-01 DIAGNOSIS — R195 Other fecal abnormalities: Secondary | ICD-10-CM | POA: Diagnosis present

## 2017-06-01 DIAGNOSIS — E43 Unspecified severe protein-calorie malnutrition: Secondary | ICD-10-CM | POA: Diagnosis not present

## 2017-06-01 DIAGNOSIS — E860 Dehydration: Secondary | ICD-10-CM | POA: Diagnosis present

## 2017-06-01 DIAGNOSIS — I83209 Varicose veins of unspecified lower extremity with both ulcer of unspecified site and inflammation: Secondary | ICD-10-CM | POA: Diagnosis present

## 2017-06-01 DIAGNOSIS — M546 Pain in thoracic spine: Secondary | ICD-10-CM | POA: Diagnosis not present

## 2017-06-01 DIAGNOSIS — N390 Urinary tract infection, site not specified: Secondary | ICD-10-CM | POA: Diagnosis not present

## 2017-06-01 DIAGNOSIS — Z8673 Personal history of transient ischemic attack (TIA), and cerebral infarction without residual deficits: Secondary | ICD-10-CM | POA: Diagnosis not present

## 2017-06-01 DIAGNOSIS — E7800 Pure hypercholesterolemia, unspecified: Secondary | ICD-10-CM | POA: Diagnosis present

## 2017-06-01 DIAGNOSIS — D649 Anemia, unspecified: Secondary | ICD-10-CM | POA: Diagnosis present

## 2017-06-01 DIAGNOSIS — M7989 Other specified soft tissue disorders: Secondary | ICD-10-CM | POA: Diagnosis not present

## 2017-06-01 DIAGNOSIS — Z6841 Body Mass Index (BMI) 40.0 and over, adult: Secondary | ICD-10-CM | POA: Diagnosis not present

## 2017-06-01 DIAGNOSIS — K922 Gastrointestinal hemorrhage, unspecified: Secondary | ICD-10-CM | POA: Diagnosis not present

## 2017-06-01 DIAGNOSIS — Z7901 Long term (current) use of anticoagulants: Secondary | ICD-10-CM | POA: Diagnosis not present

## 2017-06-01 DIAGNOSIS — M1990 Unspecified osteoarthritis, unspecified site: Secondary | ICD-10-CM | POA: Diagnosis present

## 2017-06-01 DIAGNOSIS — Z79899 Other long term (current) drug therapy: Secondary | ICD-10-CM | POA: Diagnosis not present

## 2017-06-07 ENCOUNTER — Ambulatory Visit: Payer: Medicare Other | Admitting: Cardiology

## 2017-06-08 DIAGNOSIS — I959 Hypotension, unspecified: Secondary | ICD-10-CM | POA: Diagnosis not present

## 2017-06-08 DIAGNOSIS — L03116 Cellulitis of left lower limb: Secondary | ICD-10-CM | POA: Diagnosis not present

## 2017-06-08 DIAGNOSIS — D649 Anemia, unspecified: Secondary | ICD-10-CM | POA: Diagnosis not present

## 2017-06-08 DIAGNOSIS — I509 Heart failure, unspecified: Secondary | ICD-10-CM | POA: Diagnosis not present

## 2017-06-08 DIAGNOSIS — R0789 Other chest pain: Secondary | ICD-10-CM | POA: Diagnosis not present

## 2017-06-08 DIAGNOSIS — E876 Hypokalemia: Secondary | ICD-10-CM | POA: Diagnosis not present

## 2017-06-08 DIAGNOSIS — I83009 Varicose veins of unspecified lower extremity with ulcer of unspecified site: Secondary | ICD-10-CM | POA: Diagnosis not present

## 2017-06-08 DIAGNOSIS — Z6841 Body Mass Index (BMI) 40.0 and over, adult: Secondary | ICD-10-CM | POA: Diagnosis not present

## 2017-06-08 DIAGNOSIS — I4891 Unspecified atrial fibrillation: Secondary | ICD-10-CM | POA: Diagnosis not present

## 2017-06-08 NOTE — Progress Notes (Signed)
Cardiology Office Note:    Date:  06/13/2017   ID:  Tyler Mckinney, DOB Aug 16, 1955, MRN [id number]  PCP:  Cyndi Bender, PA-C  Cardiologist:  Shirlee More, MD    Referring MD: Cyndi Bender, PA-C    ASSESSMENT:    1. Chronic systolic heart failure ([place])   2. Hypertensive heart failure (HCC)   3. Persistent atrial fibrillation ([place])   4. Chronic anticoagulation    PLAN:    In order of problems listed above:  1. Improving his weight is down 9 pounds since hospital discharge and I would continue his current diuretic sodium restriction and self-management.  Recent labs from his PCP requested.  Last ejection fraction was in the range of 40-45% in March of this year. 2. Stable continue current medical treatment including diuretics beta-blocker and ARB and clonidine 3. Stable rate controlled continue current rate slowing medication beta-blocker along with his anticoagulant.   Next appointment: 3 months   Medication Adjustments/Labs and Tests Ordered: Current medicines are reviewed at length with the patient today.  Concerns regarding medicines are outlined above.  No orders of the defined types were placed in this encounter.  No orders of the defined types were placed in this encounter.   Chief Complaint  Patient presents with  . Hospitalization Follow-up  . Congestive Heart Failure  . Atrial Fibrillation    History of Present Illness:    Tyler Mckinney is a 61 y.o. male with a hx of persistent AF sice April 2015 without an attempt to resume [place], mildly decreased EF with severe LAE, hypertension, obesity, sleep apnea  last seen 3 months ago with compensated heart failure. He had a recent [facility] admission 06/01/2017 to 06/03/2017 with hypotension severe hypokalemia cellulitis and venous stasis ulcers  and chest pain . There was concerned about acute GI bleed was felt to be due to lab error.  At the time of hospital discharge hemoglobin was 11.8 potassium 5.0  creatinine 1.0.  Troponin assays were normal cholesterol was 52 LDL cholesterol 17. Compliance with diet, lifestyle and medications: yes Prior to hospitalization his weight increase in the range of 40 pounds associated with prednisone.  His predominant complaint was weight gain edema shortness of breath but he also had brief nonexertional nonanginal chest pain.  He had no evidence of acute coronary syndrome.  Since discharge his weight has dropped 9 pounds he is back to his usual baseline endurance exertional dyspnea no orthopnea chest pain palpitations syncope TIA or bleeding. Past Medical History:  Diagnosis Date  . Allergy   . Arthritis   . Atrial fibrillation ([place]) 08/21/2015  . CHF (congestive heart failure) ([place])   . Chronic pain syndrome 02/04/2017  . Degenerative disc disease, lumbar 01/29/2013  . HTN (hypertension) 08/21/2015  . Hypertension   . Insomnia 08/21/2015  . Low back pain 02/04/2017  . Morbid obesity ([place]) 02/04/2017  . OSA (obstructive sleep apnea) 08/21/2015  . Pain in knee joint 11/30/2012  . Primary osteoarthritis of both knees 12/28/2012  . Sleep apnea     Past Surgical History:  Procedure Laterality Date  . FRACTURE SURGERY    . HERNIA REPAIR    . LEG SURGERY Left    wound debridement    Current Medications: Current Meds  Medication Sig  . acetaminophen (TYLENOL) 500 MG tablet Take 500 mg by mouth as needed.  Marland Kitchen apixaban (ELIQUIS) 5 MG TABS tablet Take 1 tablet (5 mg total) by mouth 2 (two) times daily.  . Ascorbic Acid (  VITAMIN C) 1000 MG tablet Take 1,000 mg by mouth daily.  . carvedilol (COREG) 25 MG tablet TK 1 T PO  BID  . clindamycin (CLEOCIN) 300 MG capsule TK 1 C PO QID FOR INFECTION  . cloNIDine (CATAPRES) 0.3 MG tablet Take 1 tablet (0.3 mg total) by mouth at bedtime.  . cyclobenzaprine (FLEXERIL) 5 MG tablet Take 5 mg by mouth at bedtime.  . DULoxetine (CYMBALTA) 60 MG capsule Take 60 mg by mouth daily.  . furosemide (LASIX) 40 MG tablet Take 60 mg by  mouth daily.  Marland Kitchen losartan (COZAAR) 50 MG tablet Take 50 mg by mouth daily.  . potassium chloride (K-DUR,KLOR-CON) 10 MEQ tablet Take 10 mEq by mouth daily.  . Testosterone Cypionate 200 MG/ML KIT Inject 50 mg into the muscle every 14 (fourteen) days.  . [DISCONTINUED] testosterone cypionate (DEPOTESTOSTERONE CYPIONATE) 200 MG/ML injection Inject into the muscle every 14 (fourteen) days.     Allergies:   Penicillins; Tetanus toxoids; Hydralazine; and Isosorbide nitrate   Social History   Socioeconomic History  . Marital status: Unknown    Spouse name: None  . Number of children: None  . Years of education: None  . Highest education level: None  Social Needs  . Financial resource strain: None  . Food insecurity - worry: None  . Food insecurity - inability: None  . Transportation needs - medical: None  . Transportation needs - non-medical: None  Occupational History  . None  Tobacco Use  . Smoking status: Never Smoker  . Smokeless tobacco: Never Used  Substance and Sexual Activity  . Alcohol use: No    Alcohol/week: 0.0 oz  . Drug use: No  . Sexual activity: None  Other Topics Concern  . None  Social History Narrative  . None     Family History: The patient's family history includes Arthritis in his father and mother; Cancer in his mother; Diabetes in his father; Miscarriages / [place] in his mother. ROS:   Please see the history of present illness.    All other systems reviewed and are negative.  EKGs/Labs/Other Studies Reviewed:    The following studies were reviewed today:  EKG: Recent E[facility] at [facility] sinus rhythm nonspecific conduction delay  Recent Labs: No results found for requested labs within last 8760 hours.  Recent Lipid Panel No results found for: CHOL, TRIG, HDL, CHOLHDL, VLDL, LDLCALC, LDLDIRECT  Physical Exam:    VS:  BP 120/80 (BP Location: Left Arm, Patient Position: Sitting, Cuff Size: Large)   Pulse 71   Ht _0  (1.778 m)   Wt  (!) 353 lb (160.1 kg)   SpO2 98%   BMI 50.65 kg/m     Wt Readings from Last 3 Encounters:  06/13/17 (!) 353 lb (160.1 kg)  02/23/17 (!) 317 lb 6.4 oz (144 kg)     GEN:  Well nourished, well developed in no acute distress HEENT: Normal NECK: No JVD; No carotid bruits LYMPHATICS: No lymphadenopathy CARDIAC: Irr irr variable s1 RRR, no murmurs, rubs, gallops RESPIRATORY:  Clear to auscultation without rales, wheezing or rhonchi  ABDOMEN: Soft, non-tender, non-distended MUSCULOSKELETAL:  3+ edema; No deformity  SKIN: Warm and dry NEUROLOGIC:  Alert and oriented x 3 PSYCHIATRIC:  Normal affect    Signed, Shirlee More, MD  06/13/2017 3:41 PM    [facility]

## 2017-06-13 ENCOUNTER — Ambulatory Visit (INDEPENDENT_AMBULATORY_CARE_PROVIDER_SITE_OTHER): Payer: Medicare Other | Admitting: Cardiology

## 2017-06-13 ENCOUNTER — Encounter: Payer: Self-pay | Admitting: Cardiology

## 2017-06-13 ENCOUNTER — Other Ambulatory Visit: Payer: Self-pay | Admitting: *Deleted

## 2017-06-13 ENCOUNTER — Ambulatory Visit: Payer: Medicare Other | Admitting: Cardiology

## 2017-06-13 VITALS — BP 120/80 | HR 71 | Ht 70.0 in | Wt 353.0 lb

## 2017-06-13 DIAGNOSIS — I110 Hypertensive heart disease with heart failure: Secondary | ICD-10-CM

## 2017-06-13 DIAGNOSIS — I481 Persistent atrial fibrillation: Secondary | ICD-10-CM

## 2017-06-13 DIAGNOSIS — I5022 Chronic systolic (congestive) heart failure: Secondary | ICD-10-CM | POA: Diagnosis not present

## 2017-06-13 DIAGNOSIS — Z7901 Long term (current) use of anticoagulants: Secondary | ICD-10-CM | POA: Diagnosis not present

## 2017-06-13 DIAGNOSIS — I4819 Other persistent atrial fibrillation: Secondary | ICD-10-CM

## 2017-06-13 NOTE — Patient Instructions (Signed)

## 2017-06-14 DIAGNOSIS — D509 Iron deficiency anemia, unspecified: Secondary | ICD-10-CM | POA: Diagnosis not present

## 2017-06-17 DIAGNOSIS — M4316 Spondylolisthesis, lumbar region: Secondary | ICD-10-CM | POA: Diagnosis not present

## 2017-06-17 DIAGNOSIS — G4730 Sleep apnea, unspecified: Secondary | ICD-10-CM | POA: Diagnosis not present

## 2017-06-17 DIAGNOSIS — M48061 Spinal stenosis, lumbar region without neurogenic claudication: Secondary | ICD-10-CM | POA: Diagnosis not present

## 2017-06-17 DIAGNOSIS — I509 Heart failure, unspecified: Secondary | ICD-10-CM | POA: Diagnosis not present

## 2017-06-17 DIAGNOSIS — I87313 Chronic venous hypertension (idiopathic) with ulcer of bilateral lower extremity: Secondary | ICD-10-CM | POA: Diagnosis not present

## 2017-06-17 DIAGNOSIS — L97822 Non-pressure chronic ulcer of other part of left lower leg with fat layer exposed: Secondary | ICD-10-CM | POA: Diagnosis not present

## 2017-06-17 DIAGNOSIS — M1710 Unilateral primary osteoarthritis, unspecified knee: Secondary | ICD-10-CM | POA: Diagnosis not present

## 2017-06-17 DIAGNOSIS — M5126 Other intervertebral disc displacement, lumbar region: Secondary | ICD-10-CM | POA: Diagnosis not present

## 2017-06-17 DIAGNOSIS — I4891 Unspecified atrial fibrillation: Secondary | ICD-10-CM | POA: Diagnosis not present

## 2017-06-17 DIAGNOSIS — D649 Anemia, unspecified: Secondary | ICD-10-CM | POA: Diagnosis not present

## 2017-06-17 DIAGNOSIS — I110 Hypertensive heart disease with heart failure: Secondary | ICD-10-CM | POA: Diagnosis not present

## 2017-06-17 DIAGNOSIS — I890 Lymphedema, not elsewhere classified: Secondary | ICD-10-CM | POA: Diagnosis not present

## 2017-06-17 DIAGNOSIS — M545 Low back pain: Secondary | ICD-10-CM | POA: Diagnosis not present

## 2017-06-17 DIAGNOSIS — I739 Peripheral vascular disease, unspecified: Secondary | ICD-10-CM | POA: Diagnosis not present

## 2017-06-17 DIAGNOSIS — L97812 Non-pressure chronic ulcer of other part of right lower leg with fat layer exposed: Secondary | ICD-10-CM | POA: Diagnosis not present

## 2017-06-30 DIAGNOSIS — M4727 Other spondylosis with radiculopathy, lumbosacral region: Secondary | ICD-10-CM | POA: Diagnosis not present

## 2017-06-30 DIAGNOSIS — M5127 Other intervertebral disc displacement, lumbosacral region: Secondary | ICD-10-CM | POA: Diagnosis not present

## 2017-07-01 ENCOUNTER — Telehealth: Payer: Self-pay | Admitting: *Deleted

## 2017-07-01 DIAGNOSIS — I87313 Chronic venous hypertension (idiopathic) with ulcer of bilateral lower extremity: Secondary | ICD-10-CM | POA: Diagnosis not present

## 2017-07-01 DIAGNOSIS — L97829 Non-pressure chronic ulcer of other part of left lower leg with unspecified severity: Secondary | ICD-10-CM | POA: Diagnosis not present

## 2017-07-01 DIAGNOSIS — L97812 Non-pressure chronic ulcer of other part of right lower leg with fat layer exposed: Secondary | ICD-10-CM | POA: Diagnosis not present

## 2017-07-01 DIAGNOSIS — I87311 Chronic venous hypertension (idiopathic) with ulcer of right lower extremity: Secondary | ICD-10-CM | POA: Diagnosis not present

## 2017-07-01 DIAGNOSIS — I890 Lymphedema, not elsewhere classified: Secondary | ICD-10-CM | POA: Diagnosis not present

## 2017-07-01 NOTE — Telephone Encounter (Signed)
   [facility] Pre-operative Risk Assessment    Request for surgical clearance:  1. What type of surgery is being performed? L3-4 LAMINECTOMY WITH LEFT DISCECTOMY   2. When is this surgery scheduled? NOT SCHEDULED YET   3. Are there any medications that need to be held prior to surgery and how long? ELIQUIS   4. Practice name and name of physician performing surgery? [facility]   5. What is your office phone and fax number?        PH# [phone]; FAX# [phone]   6. Anesthesia type (None, local, MAC, general) ? GENERAL   Tyler Mckinney 07/01/2017, 3:34 PM  _________________________________________________________________   (provider comments below)

## 2017-07-04 DIAGNOSIS — I4891 Unspecified atrial fibrillation: Secondary | ICD-10-CM | POA: Diagnosis not present

## 2017-07-04 DIAGNOSIS — I509 Heart failure, unspecified: Secondary | ICD-10-CM | POA: Diagnosis not present

## 2017-07-04 DIAGNOSIS — R079 Chest pain, unspecified: Secondary | ICD-10-CM | POA: Diagnosis not present

## 2017-07-04 DIAGNOSIS — I10 Essential (primary) hypertension: Secondary | ICD-10-CM | POA: Diagnosis not present

## 2017-07-04 DIAGNOSIS — L03115 Cellulitis of right lower limb: Secondary | ICD-10-CM | POA: Diagnosis not present

## 2017-07-04 DIAGNOSIS — I119 Hypertensive heart disease without heart failure: Secondary | ICD-10-CM | POA: Diagnosis not present

## 2017-07-04 NOTE — Telephone Encounter (Signed)
   Primary Cardiologist: Norman Herrlich, MD  Chart reviewed as part of pre-operative protocol coverage. Jobie Reasoner was last seen on 06/13/17 by Dr. Dulce Sellar. Chart reports severe obesity, presistent atrial fib, OSA, hypertension, recent admission for severe hypokalemia, cellulitis, venous stasis ulcers, and chest pain, last EF reportedly 40-45% in March. I do not have enough information to complete revised cardiac risk index per protocol as it is not clear to me if patient has ever had ischemic assessment for his recent chest pain and LV dysfunction. Will forward to Dr. Dulce Sellar for further input on how to proceed with pre-operative clearance. Dr. Dulce Sellar please route response to P CV DIV PREOP. Thank you.  Laurann Montana, PA-C 07/04/2017, 1:51 PM

## 2017-07-04 NOTE — Telephone Encounter (Signed)
I have no records of an ischemia evaluation. He has no history of CAD, no pattern of angina, I would proceed with his  surgery and not do a stress test, very difficult with his limitations and body habitus, or refer to coronary angiography.

## 2017-07-05 ENCOUNTER — Other Ambulatory Visit: Payer: Self-pay | Admitting: Neurological Surgery

## 2017-07-05 ENCOUNTER — Telehealth: Payer: Self-pay | Admitting: Nurse Practitioner

## 2017-07-05 NOTE — Telephone Encounter (Signed)
   Primary Cardiologist: Norman Herrlich, MD  Chart reviewed as part of pre-operative protocol coverage. Given past medical history and time since last visit, based on ACC/AHA guidelines, Tyler Mckinney would be at acceptable risk for the planned procedure without further cardiovascular testing. He may hold Eliquis beginning three days prior to surgery with a plan to resume Eliquis postoperatively when felt to be feasible from a surgical perspective.  Please call with questions.  Nicolasa Ducking, NP 07/05/2017, 6:00 PM

## 2017-07-08 DIAGNOSIS — L97812 Non-pressure chronic ulcer of other part of right lower leg with fat layer exposed: Secondary | ICD-10-CM | POA: Diagnosis not present

## 2017-07-08 DIAGNOSIS — I872 Venous insufficiency (chronic) (peripheral): Secondary | ICD-10-CM | POA: Diagnosis not present

## 2017-07-08 DIAGNOSIS — I87311 Chronic venous hypertension (idiopathic) with ulcer of right lower extremity: Secondary | ICD-10-CM | POA: Diagnosis not present

## 2017-07-20 NOTE — Pre-Procedure Instructions (Signed)
Cheyenne Selz  07/20/2017      [id number] - [place], [place] - [address] AT [facility] [phone] [place] [address] Phone: [phone] Fax: [phone]    Your procedure is scheduled on July 25, 2017.  Report to [facility] Admitting at 530 AM.  Call this number if you have problems the morning of surgery:  [phone]   Remember:  Do not eat food or drink liquids after midnight.  Take these medicines the morning of surgery with A SIP OF WATER acetaminophen (tylenol), carvedilol (coreg), cyclobenzaprine (flexeril), duloxetine (cymbalta).  Stop/continue Eliquis as instructed by Designer, industrial/product.  7 days prior to surgery STOP taking any Aspirin (unless otherwise instructed by your surgeon), Aleve, Naproxen, Ibuprofen, Motrin, Advil, Goody's, BC's, all herbal medications, fish oil, and all vitamins  Continue all other medications as instructed by your physician except follow the above medication instructions before surgery   Do not wear jewelry, make-up or nail polish.  Do not wear lotions, powders, or perfumes, or deodorant.  Do not shave 48 hours prior to surgery.  Men may shave face and neck.  Do not bring valuables to the hospital.  [facility] is not responsible for any belongings or valuables.  Contacts, dentures or bridgework may not be worn into surgery.  Leave your suitcase in the car.  After surgery it may be brought to your room.  For patients admitted to the hospital, discharge time will be determined by your treatment team.  Patients discharged the day of surgery will not be allowed to drive home.   Special instructions:   [facility]- Preparing For Surgery  Before surgery, you can play an important role. Because skin is not sterile, your skin needs to be as free of germs as possible. You can reduce the number of germs on your skin by washing with CHG (chlorahexidine gluconate) Soap before surgery.  CHG is an  antiseptic cleaner which kills germs and bonds with the skin to continue killing germs even after washing.  Please do not use if you have an allergy to CHG or antibacterial soaps. If your skin becomes reddened/irritated stop using the CHG.  Do not shave (including legs and underarms) for at least 48 hours prior to first CHG shower. It is OK to shave your face.  Please follow these instructions carefully.   1. Shower the NIGHT BEFORE SURGERY and the MORNING OF SURGERY with CHG.   2. If you chose to wash your hair, wash your hair first as usual with your normal shampoo.  3. After you shampoo, rinse your hair and body thoroughly to remove the shampoo.  4. Use CHG as you would any other liquid soap. You can apply CHG directly to the skin and wash gently with a scrungie or a clean washcloth.   5. Apply the CHG Soap to your body ONLY FROM THE NECK DOWN.  Do not use on open wounds or open sores. Avoid contact with your eyes, ears, mouth and genitals (private parts). Wash Face and genitals (private parts)  with your normal soap.  6. Wash thoroughly, paying special attention to the area where your surgery will be performed.  7. Thoroughly rinse your body with warm water from the neck down.  8. DO NOT shower/wash with your normal soap after using and rinsing off the CHG Soap.  9. Pat yourself dry with a CLEAN TOWEL.  10. Wear CLEAN PAJAMAS to bed the  night before surgery, wear comfortable clothes the morning of surgery  11. Place CLEAN SHEETS on your bed the night of your first shower and DO NOT SLEEP WITH PETS.   Day of Surgery: Do not apply any deodorants/lotions. Please wear clean clothes to the hospital/surgery center.    Please read over the following fact sheets that you were given. Pain Booklet, Coughing and Deep Breathing, MRSA Information and Surgical Site Infection Prevention

## 2017-07-21 ENCOUNTER — Encounter (HOSPITAL_COMMUNITY): Payer: Self-pay

## 2017-07-21 ENCOUNTER — Encounter (HOSPITAL_COMMUNITY)
Admission: RE | Admit: 2017-07-21 | Discharge: 2017-07-21 | Disposition: A | Discharge status: Home / Self Care | Payer: Medicare Other | Source: Ambulatory Visit | Attending: Neurological Surgery | Admitting: Neurological Surgery

## 2017-07-21 ENCOUNTER — Other Ambulatory Visit: Payer: Self-pay

## 2017-07-21 DIAGNOSIS — Z01818 Encounter for other preprocedural examination: Secondary | ICD-10-CM | POA: Diagnosis not present

## 2017-07-21 DIAGNOSIS — I272 Pulmonary hypertension, unspecified: Secondary | ICD-10-CM | POA: Diagnosis not present

## 2017-07-21 DIAGNOSIS — I110 Hypertensive heart disease with heart failure: Secondary | ICD-10-CM | POA: Diagnosis not present

## 2017-07-21 DIAGNOSIS — Z01812 Encounter for preprocedural laboratory examination: Secondary | ICD-10-CM | POA: Insufficient documentation

## 2017-07-21 DIAGNOSIS — I481 Persistent atrial fibrillation: Secondary | ICD-10-CM | POA: Insufficient documentation

## 2017-07-21 DIAGNOSIS — Z79899 Other long term (current) drug therapy: Secondary | ICD-10-CM | POA: Insufficient documentation

## 2017-07-21 DIAGNOSIS — I350 Nonrheumatic aortic (valve) stenosis: Secondary | ICD-10-CM | POA: Insufficient documentation

## 2017-07-21 DIAGNOSIS — I5022 Chronic systolic (congestive) heart failure: Secondary | ICD-10-CM | POA: Diagnosis not present

## 2017-07-21 DIAGNOSIS — G4700 Insomnia, unspecified: Secondary | ICD-10-CM | POA: Insufficient documentation

## 2017-07-21 DIAGNOSIS — G4733 Obstructive sleep apnea (adult) (pediatric): Secondary | ICD-10-CM | POA: Insufficient documentation

## 2017-07-21 DIAGNOSIS — Z7901 Long term (current) use of anticoagulants: Secondary | ICD-10-CM | POA: Insufficient documentation

## 2017-07-21 DIAGNOSIS — G894 Chronic pain syndrome: Secondary | ICD-10-CM | POA: Insufficient documentation

## 2017-07-21 HISTORY — DX: Personal history of urinary calculi: Z87.442

## 2017-07-21 HISTORY — DX: Pulmonary hypertension, unspecified: I27.20

## 2017-07-21 HISTORY — DX: Nonrheumatic aortic (valve) stenosis: I35.0

## 2017-07-21 LAB — CBC
HCT: 45.3 % (ref 39.0–52.0)
HEMOGLOBIN: 13.3 g/dL (ref 13.0–17.0)
MCH: 21.2 pg — ABNORMAL LOW (ref 26.0–34.0)
MCHC: 29.4 g/dL — ABNORMAL LOW (ref 30.0–36.0)
MCV: 72.2 fL — ABNORMAL LOW (ref 78.0–100.0)
PLATELETS: 402 10*3/uL — AB (ref 150–400)
RBC: 6.27 MIL/uL — AB (ref 4.22–5.81)
RDW: 16.3 % — ABNORMAL HIGH (ref 11.5–15.5)
WBC: 10.3 10*3/uL (ref 4.0–10.5)

## 2017-07-21 LAB — BASIC METABOLIC PANEL
Anion gap: 8 (ref 5–15)
BUN: 22 mg/dL — ABNORMAL HIGH (ref 6–20)
CALCIUM: 9.1 mg/dL (ref 8.9–10.3)
CO2: 28 mmol/L (ref 22–32)
CREATININE: 1.17 mg/dL (ref 0.61–1.24)
Chloride: 103 mmol/L (ref 101–111)
Glucose, Bld: 96 mg/dL (ref 65–99)
Potassium: 3.8 mmol/L (ref 3.5–5.1)
SODIUM: 139 mmol/L (ref 135–145)

## 2017-07-21 LAB — SURGICAL PCR SCREEN
MRSA, PCR: POSITIVE — AB
Staphylococcus aureus: POSITIVE — AB

## 2017-07-21 NOTE — Progress Notes (Signed)
Pt positive for staph and MRSA from nasal pcr swab.  Will need betadine ointment DOS per protocol.

## 2017-07-21 NOTE — Progress Notes (Signed)
PCP: Lonie Peak, PA-C at [facility] Cardiologist: Dr. Dulce Sellar-- clearance note in Epic Wound Care: Dr. Georgetta Haber reports venous stasis ulcers to bilateral lower legs, has been seen at wound center for "almost 2 years." Niece helps him do every other day wound changes. Pt reports Dr. Bevely Palmer is aware. Dressings CD&I at PAT appt.   EKG: 06/01/2017--spoke with Orvan Seen with anesthesia, did not need to repeat since pt has a known history of persistent A-fib CXR: 06/01/2017 ECHO: Pt reports Nov 2018 at [facility] faxed request for records Stress Test: denies Cardiac Cath: denies  Pt reports he no longer uses CPAP.  Reports he "no longer has OSA since losing over 100 pounds."  PCP is aware CPAP usage has been discontinued.   Last Eliquis dose today, lab for PT DOS.   Patient requested it be documented that he "does well with Nucynta" for pain management.   Patient denies shortness of breath, fever, cough, and chest pain at PAT appointment.  Patient verbalized understanding of instructions provided today at the PAT appointment.  Patient asked to review instructions at home and day of surgery.

## 2017-07-22 ENCOUNTER — Encounter (HOSPITAL_COMMUNITY): Payer: Self-pay

## 2017-07-22 NOTE — Progress Notes (Addendum)
Anesthesia Chart Review: Patient is a 62 year old male scheduled for L3-4 laminectomy with left discectomy on 07/25/17 by Dr. Sharlet Salina Ditty.   History includes never smoker, chronic systolic CHF, OSA (not CPAP since weight loss), HTN, persistent afib, chronic pain syndrome, insomnia. Severe pulmonary hypertension and mild AS were noted on his 12/2016 echo, so these were added to his history. He also reports issues with BLE venous stasis ulcers which have had on-going treatment at the would center for nearly two years. (He reported Dr. Bevely Palmer was aware of this.) BMI is consistent with morbid obesity.   - PCP is Lonie Peak, PA-C at [facility]. - Wound Care provider is Dr. Liston Alba.  - Cardiologist is Dr. Rosanne Sack. His last office visit was on 06/13/17. His note mentions patient's EF was 40-45% by 09/2016 echo, but last echo received from [facility] from 12/2016 showed an EF of 30-35%--and this is the echo initially outlined in Dr. Hulen Shouts 02/23/17 office note with mention of repeating an echo at his next visit and considering referral for ICD if EF remained < 35%. Surgeon did reach out to cardiology for preoperative clearance. Initially reviewed by Ronie Spies, PA-C who referred to Dr. Dulce Sellar as patient has not had an ischemic evaluation despite known afib and CHF with LV dysfunction. Dr. Dulce Sellar responded on 07/01/17, "I have no records of an ischemia evaluation. He has no history of CAD, no pattern of angina, I would proceed with his  surgery and not do a stress test, very difficult with his limitations and body habitus, or refer to coronary angiography." Eliquis was later addressed on 07/05/17 by Nicolasa Ducking, NP with permission to hold for three days.   Meds include Eliquis (last dose 07/22/17), Coreg, clonidine, Flexeril, Cymbalta, Lasix, losartan, KCl, testosterone cypionate.   BP (!) 142/89   Pulse 92   Temp 36.9 C   Resp 20   Ht 5\' 10"  (1.778 m)   Wt (!) 360 lb 3.2 oz  (163.4 kg)   SpO2 100%   BMI 51.68 kg/m   EKG 06/01/17: Afib at 78 bpm, non-specific intraventricular conduction delay, non-specific ST abnormality.  Echo 01/12/17 [facility]):  1. There is severe global hypokinesis of LV. 2. Overall LV systolic function is moderate-severely impaired with an EF between 30-35%.  3. The RV is severely enlarged. 4. RV systolic function is severely impaired. 5. LA is severely dilated by volume. 6. RA is markedly enlarged. 7. Trace to mild AR. 8. Mild AS with peak/mean pressure gradient of (21 mmHg maxPG)/(13 mmHg meanPG). The AVA by continuity equation is 1.42 cm2.  9. Mild to moderate MR. 10. Moderate TR. There is severe pulmonary hypertension. RVSP as measured by Doppler is 84 mmHg.   1V CXR 06/01/17 [facility]): Vascular congestion and borderline cardiomegaly. Mild left sided linear opacities may reflect atelectasis or possible mild infection.   Preoperative labs noted. Cr 1.17. H/H 13.3/45.3, PLT 402. Glucose 96.   He denied SOB, cough, fever, chest pain at PAT. He wanted providers to be aware that he "does well with Nucynta" for pain management.   Despite cardiac clearance, I reviewed with anesthesiologist Dr. Sandford Craze given history of severe LV dysfunction and severe pulmonary hypertension by echo. (Severe pulmonary hypertension was not noted in records until echo received from [facility].) He has known OSA which could be contributing to RV dysfunction.  He has had recent cardiology follow-up and clearance. No ischemic testing was recommended. Anesthesiologist to evaluate on  the day of surgery for any changes.  Velna Ochs [facility] Short Stay Center/Anesthesiology Phone [phone] 07/22/2017 10:51 AM

## 2017-07-24 MED ORDER — VANCOMYCIN HCL 10 G IV SOLR
1500.0000 mg | INTRAVENOUS | Status: AC
  Administered 2017-07-25: 1500 mg via INTRAVENOUS
  Filled 2017-07-24 (×2): qty 1500

## 2017-07-25 ENCOUNTER — Encounter (HOSPITAL_COMMUNITY): Payer: Self-pay | Admitting: *Deleted

## 2017-07-25 ENCOUNTER — Ambulatory Visit (HOSPITAL_COMMUNITY)
Admission: RE | Admit: 2017-07-25 | Discharge: 2017-07-26 | Disposition: A | Discharge status: Home / Self Care | Payer: Medicare Other | Source: Ambulatory Visit | Attending: Neurological Surgery | Admitting: Neurological Surgery

## 2017-07-25 ENCOUNTER — Ambulatory Visit (HOSPITAL_COMMUNITY): Payer: Medicare Other | Admitting: Anesthesiology

## 2017-07-25 ENCOUNTER — Ambulatory Visit (HOSPITAL_COMMUNITY): Payer: Medicare Other | Admitting: Vascular Surgery

## 2017-07-25 ENCOUNTER — Ambulatory Visit (HOSPITAL_COMMUNITY): Payer: Medicare Other

## 2017-07-25 ENCOUNTER — Encounter (HOSPITAL_COMMUNITY)
Admission: RE | Disposition: A | Discharge status: Home / Self Care | Payer: Self-pay | Source: Ambulatory Visit | Attending: Neurological Surgery

## 2017-07-25 DIAGNOSIS — Z87442 Personal history of urinary calculi: Secondary | ICD-10-CM | POA: Insufficient documentation

## 2017-07-25 DIAGNOSIS — Z419 Encounter for procedure for purposes other than remedying health state, unspecified: Secondary | ICD-10-CM

## 2017-07-25 DIAGNOSIS — I110 Hypertensive heart disease with heart failure: Secondary | ICD-10-CM | POA: Diagnosis not present

## 2017-07-25 DIAGNOSIS — Z79899 Other long term (current) drug therapy: Secondary | ICD-10-CM | POA: Insufficient documentation

## 2017-07-25 DIAGNOSIS — Z981 Arthrodesis status: Secondary | ICD-10-CM | POA: Diagnosis not present

## 2017-07-25 DIAGNOSIS — I272 Pulmonary hypertension, unspecified: Secondary | ICD-10-CM | POA: Insufficient documentation

## 2017-07-25 DIAGNOSIS — M1990 Unspecified osteoarthritis, unspecified site: Secondary | ICD-10-CM | POA: Insufficient documentation

## 2017-07-25 DIAGNOSIS — Z888 Allergy status to other drugs, medicaments and biological substances status: Secondary | ICD-10-CM | POA: Insufficient documentation

## 2017-07-25 DIAGNOSIS — I5022 Chronic systolic (congestive) heart failure: Secondary | ICD-10-CM | POA: Diagnosis not present

## 2017-07-25 DIAGNOSIS — G894 Chronic pain syndrome: Secondary | ICD-10-CM | POA: Diagnosis not present

## 2017-07-25 DIAGNOSIS — M4727 Other spondylosis with radiculopathy, lumbosacral region: Secondary | ICD-10-CM | POA: Diagnosis not present

## 2017-07-25 DIAGNOSIS — M5116 Intervertebral disc disorders with radiculopathy, lumbar region: Secondary | ICD-10-CM | POA: Diagnosis not present

## 2017-07-25 DIAGNOSIS — G4733 Obstructive sleep apnea (adult) (pediatric): Secondary | ICD-10-CM | POA: Insufficient documentation

## 2017-07-25 DIAGNOSIS — M48061 Spinal stenosis, lumbar region without neurogenic claudication: Secondary | ICD-10-CM | POA: Insufficient documentation

## 2017-07-25 DIAGNOSIS — M170 Bilateral primary osteoarthritis of knee: Secondary | ICD-10-CM | POA: Insufficient documentation

## 2017-07-25 DIAGNOSIS — G4700 Insomnia, unspecified: Secondary | ICD-10-CM | POA: Insufficient documentation

## 2017-07-25 DIAGNOSIS — I4891 Unspecified atrial fibrillation: Secondary | ICD-10-CM | POA: Diagnosis not present

## 2017-07-25 DIAGNOSIS — Z6841 Body Mass Index (BMI) 40.0 and over, adult: Secondary | ICD-10-CM | POA: Diagnosis not present

## 2017-07-25 DIAGNOSIS — Z887 Allergy status to serum and vaccine status: Secondary | ICD-10-CM | POA: Diagnosis not present

## 2017-07-25 DIAGNOSIS — M545 Low back pain: Secondary | ICD-10-CM | POA: Diagnosis not present

## 2017-07-25 DIAGNOSIS — I350 Nonrheumatic aortic (valve) stenosis: Secondary | ICD-10-CM | POA: Diagnosis not present

## 2017-07-25 DIAGNOSIS — M5127 Other intervertebral disc displacement, lumbosacral region: Secondary | ICD-10-CM | POA: Diagnosis not present

## 2017-07-25 DIAGNOSIS — M4726 Other spondylosis with radiculopathy, lumbar region: Secondary | ICD-10-CM | POA: Diagnosis not present

## 2017-07-25 DIAGNOSIS — Z880 Allergy status to penicillin: Secondary | ICD-10-CM | POA: Diagnosis not present

## 2017-07-25 HISTORY — PX: LUMBAR LAMINECTOMY/DECOMPRESSION MICRODISCECTOMY: SHX5026

## 2017-07-25 LAB — GLUCOSE, CAPILLARY: GLUCOSE-CAPILLARY: 226 mg/dL — AB (ref 65–99)

## 2017-07-25 LAB — PROTIME-INR
INR: 1.18
Prothrombin Time: 15 seconds (ref 11.4–15.2)

## 2017-07-25 SURGERY — LUMBAR LAMINECTOMY/DECOMPRESSION MICRODISCECTOMY 1 LEVEL
Anesthesia: General

## 2017-07-25 MED ORDER — BUPIVACAINE-EPINEPHRINE (PF) 0.5% -1:200000 IJ SOLN
INTRAMUSCULAR | Status: DC | PRN
  Administered 2017-07-25: 15 mL via PERINEURAL
  Administered 2017-07-25: 30 mL via PERINEURAL

## 2017-07-25 MED ORDER — CLONIDINE HCL 0.1 MG PO TABS
0.3000 mg | ORAL_TABLET | Freq: Every day | ORAL | Status: DC
  Administered 2017-07-25: 0.3 mg via ORAL
  Filled 2017-07-25: qty 3

## 2017-07-25 MED ORDER — OXYCODONE HCL 5 MG/5ML PO SOLN: 5.0000 mg | Freq: Once | ORAL | Status: DC | PRN

## 2017-07-25 MED ORDER — LIDOCAINE-EPINEPHRINE 2 %-1:100000 IJ SOLN
INTRAMUSCULAR | Status: DC | PRN
  Administered 2017-07-25: 15 mL via INTRADERMAL

## 2017-07-25 MED ORDER — SENNA 8.6 MG PO TABS
1.0000 | ORAL_TABLET | Freq: Two times a day (BID) | ORAL | Status: DC
  Administered 2017-07-25 – 2017-07-26 (×2): 8.6 mg via ORAL
  Filled 2017-07-25 (×2): qty 1

## 2017-07-25 MED ORDER — POTASSIUM CHLORIDE IN NACL 20-0.9 MEQ/L-% IV SOLN: 100.0000 mL/h | INTRAVENOUS | Status: DC

## 2017-07-25 MED ORDER — EPHEDRINE 5 MG/ML INJ
INTRAVENOUS | Status: AC
  Filled 2017-07-25: qty 10

## 2017-07-25 MED ORDER — ROCURONIUM BROMIDE 10 MG/ML (PF) SYRINGE
PREFILLED_SYRINGE | INTRAVENOUS | Status: AC
  Filled 2017-07-25: qty 5

## 2017-07-25 MED ORDER — THROMBIN (RECOMBINANT) 5000 UNITS EX SOLR
CUTANEOUS | Status: AC
  Filled 2017-07-25: qty 5000

## 2017-07-25 MED ORDER — SUCCINYLCHOLINE CHLORIDE 200 MG/10ML IV SOSY
PREFILLED_SYRINGE | INTRAVENOUS | Status: AC
  Filled 2017-07-25: qty 10

## 2017-07-25 MED ORDER — BISACODYL 10 MG RE SUPP: 10.0000 mg | Freq: Every day | RECTAL | Status: DC | PRN

## 2017-07-25 MED ORDER — EPHEDRINE SULFATE-NACL 50-0.9 MG/10ML-% IV SOSY
PREFILLED_SYRINGE | INTRAVENOUS | Status: DC | PRN
  Administered 2017-07-25 (×5): 10 mg via INTRAVENOUS

## 2017-07-25 MED ORDER — DOCUSATE SODIUM 100 MG PO CAPS
100.0000 mg | ORAL_CAPSULE | Freq: Two times a day (BID) | ORAL | Status: DC
  Administered 2017-07-25 – 2017-07-26 (×2): 100 mg via ORAL
  Filled 2017-07-25 (×2): qty 1

## 2017-07-25 MED ORDER — PROPOFOL 10 MG/ML IV BOLUS
INTRAVENOUS | Status: AC
  Filled 2017-07-25: qty 20

## 2017-07-25 MED ORDER — ONDANSETRON HCL 4 MG/2ML IJ SOLN
INTRAMUSCULAR | Status: AC
  Filled 2017-07-25: qty 2

## 2017-07-25 MED ORDER — KETOROLAC TROMETHAMINE 30 MG/ML IJ SOLN
INTRAMUSCULAR | Status: AC
  Filled 2017-07-25: qty 1

## 2017-07-25 MED ORDER — ONDANSETRON HCL 4 MG/2ML IJ SOLN: 4.0000 mg | Freq: Four times a day (QID) | INTRAMUSCULAR | Status: DC | PRN

## 2017-07-25 MED ORDER — VANCOMYCIN HCL 10 G IV SOLR
1500.0000 mg | Freq: Once | INTRAVENOUS | Status: AC
  Administered 2017-07-25: 1500 mg via INTRAVENOUS
  Filled 2017-07-25: qty 1500

## 2017-07-25 MED ORDER — FENTANYL CITRATE (PF) 100 MCG/2ML IJ SOLN
INTRAMUSCULAR | Status: DC | PRN
  Administered 2017-07-25: 100 ug via INTRAVENOUS

## 2017-07-25 MED ORDER — ACETAMINOPHEN 500 MG PO TABS
1000.0000 mg | ORAL_TABLET | Freq: Four times a day (QID) | ORAL | Status: DC | PRN
  Filled 2017-07-25: qty 2

## 2017-07-25 MED ORDER — FUROSEMIDE 80 MG PO TABS
80.0000 mg | ORAL_TABLET | Freq: Every day | ORAL | Status: DC
  Administered 2017-07-25 – 2017-07-26 (×2): 80 mg via ORAL
  Filled 2017-07-25 (×2): qty 1

## 2017-07-25 MED ORDER — CELECOXIB 200 MG PO CAPS
200.0000 mg | ORAL_CAPSULE | Freq: Two times a day (BID) | ORAL | Status: DC
  Administered 2017-07-25 – 2017-07-26 (×2): 200 mg via ORAL
  Filled 2017-07-25 (×3): qty 1

## 2017-07-25 MED ORDER — LACTATED RINGERS IV SOLN
INTRAVENOUS | Status: DC | PRN
  Administered 2017-07-25 (×2): via INTRAVENOUS

## 2017-07-25 MED ORDER — OXYCODONE HCL 5 MG PO TABS
5.0000 mg | ORAL_TABLET | ORAL | Status: DC | PRN
  Administered 2017-07-26: 5 mg via ORAL
  Filled 2017-07-25: qty 1

## 2017-07-25 MED ORDER — 0.9 % SODIUM CHLORIDE (POUR BTL) OPTIME
TOPICAL | Status: DC | PRN
  Administered 2017-07-25: 1000 mL

## 2017-07-25 MED ORDER — CHLORHEXIDINE GLUCONATE CLOTH 2 % EX PADS: 6.0000 | MEDICATED_PAD | Freq: Once | CUTANEOUS | Status: DC

## 2017-07-25 MED ORDER — VASOPRESSIN 20 UNIT/ML IV SOLN
0.0300 [IU]/min | Freq: Once | INTRAVENOUS | Status: AC
  Administered 2017-07-25: 0.03 [IU]/min via INTRAVENOUS
  Filled 2017-07-25: qty 2

## 2017-07-25 MED ORDER — ONDANSETRON HCL 4 MG PO TABS: 4.0000 mg | ORAL_TABLET | Freq: Four times a day (QID) | ORAL | Status: DC | PRN

## 2017-07-25 MED ORDER — BUPIVACAINE LIPOSOME 1.3 % IJ SUSP
20.0000 mL | Freq: Once | INTRAMUSCULAR | Status: DC
  Filled 2017-07-25: qty 20

## 2017-07-25 MED ORDER — TESTOSTERONE CYPIONATE 200 MG/ML IM SOLN: 100.0000 mg | INTRAMUSCULAR | Status: DC

## 2017-07-25 MED ORDER — FENTANYL CITRATE (PF) 100 MCG/2ML IJ SOLN
INTRAMUSCULAR | Status: AC
  Filled 2017-07-25: qty 2

## 2017-07-25 MED ORDER — ACETAMINOPHEN 500 MG PO TABS
1000.0000 mg | ORAL_TABLET | Freq: Four times a day (QID) | ORAL | Status: DC
  Administered 2017-07-25 – 2017-07-26 (×5): 1000 mg via ORAL
  Filled 2017-07-25 (×4): qty 2

## 2017-07-25 MED ORDER — SUCCINYLCHOLINE CHLORIDE 200 MG/10ML IV SOSY
PREFILLED_SYRINGE | INTRAVENOUS | Status: DC | PRN
  Administered 2017-07-25: 180 mg via INTRAVENOUS

## 2017-07-25 MED ORDER — PROPOFOL 10 MG/ML IV BOLUS
INTRAVENOUS | Status: DC | PRN
  Administered 2017-07-25: 150 mg via INTRAVENOUS

## 2017-07-25 MED ORDER — PHENYLEPHRINE 40 MCG/ML (10ML) SYRINGE FOR IV PUSH (FOR BLOOD PRESSURE SUPPORT)
PREFILLED_SYRINGE | INTRAVENOUS | Status: AC
  Filled 2017-07-25: qty 10

## 2017-07-25 MED ORDER — SUGAMMADEX SODIUM 500 MG/5ML IV SOLN
INTRAVENOUS | Status: AC
  Filled 2017-07-25: qty 5

## 2017-07-25 MED ORDER — VANCOMYCIN HCL 1000 MG IV SOLR
INTRAVENOUS | Status: DC | PRN
  Administered 2017-07-25: 1000 mg

## 2017-07-25 MED ORDER — ONDANSETRON HCL 4 MG/2ML IJ SOLN: 4.0000 mg | Freq: Once | INTRAMUSCULAR | Status: DC | PRN

## 2017-07-25 MED ORDER — DEXAMETHASONE SODIUM PHOSPHATE 10 MG/ML IJ SOLN
INTRAMUSCULAR | Status: AC
  Filled 2017-07-25: qty 1

## 2017-07-25 MED ORDER — MENTHOL 3 MG MT LOZG: 1.0000 | LOZENGE | OROMUCOSAL | Status: DC | PRN

## 2017-07-25 MED ORDER — VASOPRESSIN 20 UNIT/ML IV SOLN
INTRAVENOUS | Status: AC
  Filled 2017-07-25: qty 1

## 2017-07-25 MED ORDER — THROMBIN (RECOMBINANT) 5000 UNITS EX SOLR
CUTANEOUS | Status: DC | PRN
  Administered 2017-07-25: 5000 [IU] via TOPICAL

## 2017-07-25 MED ORDER — SODIUM CHLORIDE 0.9% FLUSH: 3.0000 mL | INTRAVENOUS | Status: DC | PRN

## 2017-07-25 MED ORDER — ALUM & MAG HYDROXIDE-SIMETH 200-200-20 MG/5ML PO SUSP: 30.0000 mL | Freq: Four times a day (QID) | ORAL | Status: DC | PRN

## 2017-07-25 MED ORDER — METHYLPREDNISOLONE ACETATE 80 MG/ML IJ SUSP
INTRAMUSCULAR | Status: AC
  Filled 2017-07-25: qty 1

## 2017-07-25 MED ORDER — BUPIVACAINE HCL (PF) 0.25 % IJ SOLN
INTRAMUSCULAR | Status: AC
  Filled 2017-07-25: qty 30

## 2017-07-25 MED ORDER — OXYCODONE HCL 5 MG PO TABS: 5.0000 mg | ORAL_TABLET | Freq: Once | ORAL | Status: DC | PRN

## 2017-07-25 MED ORDER — BUPIVACAINE-EPINEPHRINE (PF) 0.5% -1:200000 IJ SOLN
INTRAMUSCULAR | Status: AC
  Filled 2017-07-25: qty 30

## 2017-07-25 MED ORDER — MIDAZOLAM HCL 2 MG/2ML IJ SOLN
INTRAMUSCULAR | Status: AC
  Filled 2017-07-25: qty 2

## 2017-07-25 MED ORDER — FLEET ENEMA 7-19 GM/118ML RE ENEM: 1.0000 | ENEMA | Freq: Once | RECTAL | Status: DC | PRN

## 2017-07-25 MED ORDER — OXYCODONE HCL 5 MG PO TABS
10.0000 mg | ORAL_TABLET | ORAL | Status: DC | PRN
  Administered 2017-07-25 – 2017-07-26 (×3): 10 mg via ORAL
  Filled 2017-07-25 (×3): qty 2

## 2017-07-25 MED ORDER — PANTOPRAZOLE SODIUM 40 MG IV SOLR
40.0000 mg | Freq: Every day | INTRAVENOUS | Status: DC
  Administered 2017-07-25: 40 mg via INTRAVENOUS
  Filled 2017-07-25: qty 40

## 2017-07-25 MED ORDER — SODIUM CHLORIDE 0.9 % IV SOLN: 250.0000 mL | INTRAVENOUS | Status: DC

## 2017-07-25 MED ORDER — LIDOCAINE 2% (20 MG/ML) 5 ML SYRINGE
INTRAMUSCULAR | Status: AC
  Filled 2017-07-25: qty 5

## 2017-07-25 MED ORDER — SUGAMMADEX SODIUM 500 MG/5ML IV SOLN
INTRAVENOUS | Status: DC | PRN
  Administered 2017-07-25: 400 mg via INTRAVENOUS

## 2017-07-25 MED ORDER — LIDOCAINE-EPINEPHRINE 2 %-1:100000 IJ SOLN
INTRAMUSCULAR | Status: AC
  Filled 2017-07-25: qty 1

## 2017-07-25 MED ORDER — BUPIVACAINE LIPOSOME 1.3 % IJ SUSP
INTRAMUSCULAR | Status: DC | PRN
  Administered 2017-07-25: 20 mL

## 2017-07-25 MED ORDER — LIDOCAINE 2% (20 MG/ML) 5 ML SYRINGE
INTRAMUSCULAR | Status: DC | PRN
  Administered 2017-07-25: 60 mg via INTRAVENOUS

## 2017-07-25 MED ORDER — SODIUM CHLORIDE 0.9 % IJ SOLN
INTRAMUSCULAR | Status: DC | PRN
  Administered 2017-07-25: 70 mL via INTRAVENOUS

## 2017-07-25 MED ORDER — DULOXETINE HCL 60 MG PO CPEP
60.0000 mg | ORAL_CAPSULE | Freq: Every day | ORAL | Status: DC
  Administered 2017-07-26: 60 mg via ORAL
  Filled 2017-07-25: qty 1

## 2017-07-25 MED ORDER — SODIUM CHLORIDE 0.9 % IR SOLN
Status: DC | PRN
  Administered 2017-07-25: 07:00:00

## 2017-07-25 MED ORDER — GABAPENTIN 300 MG PO CAPS
300.0000 mg | ORAL_CAPSULE | Freq: Three times a day (TID) | ORAL | Status: DC
  Administered 2017-07-25 – 2017-07-26 (×3): 300 mg via ORAL
  Filled 2017-07-25 (×3): qty 1

## 2017-07-25 MED ORDER — OXYCODONE HCL ER 10 MG PO T12A
10.0000 mg | EXTENDED_RELEASE_TABLET | Freq: Two times a day (BID) | ORAL | Status: DC
  Administered 2017-07-25 – 2017-07-26 (×3): 10 mg via ORAL
  Filled 2017-07-25 (×3): qty 1

## 2017-07-25 MED ORDER — VANCOMYCIN HCL 1000 MG IV SOLR
INTRAVENOUS | Status: AC
  Filled 2017-07-25: qty 1000

## 2017-07-25 MED ORDER — LOSARTAN POTASSIUM 50 MG PO TABS
25.0000 mg | ORAL_TABLET | Freq: Every day | ORAL | Status: DC
  Administered 2017-07-25 – 2017-07-26 (×2): 25 mg via ORAL
  Filled 2017-07-25 (×2): qty 1

## 2017-07-25 MED ORDER — VASOPRESSIN 20 UNIT/ML IV SOLN
INTRAVENOUS | Status: DC | PRN
  Administered 2017-07-25: 1 [IU] via INTRAVENOUS
  Administered 2017-07-25 (×3): 2 [IU] via INTRAVENOUS
  Administered 2017-07-25: 1 [IU] via INTRAVENOUS

## 2017-07-25 MED ORDER — POTASSIUM CHLORIDE CRYS ER 10 MEQ PO TBCR: 10.0000 meq | EXTENDED_RELEASE_TABLET | Freq: Every day | ORAL | Status: DC | PRN

## 2017-07-25 MED ORDER — FENTANYL CITRATE (PF) 250 MCG/5ML IJ SOLN
INTRAMUSCULAR | Status: AC
Start: 2017-07-25 — End: 2017-07-25
  Filled 2017-07-25: qty 5

## 2017-07-25 MED ORDER — MIDAZOLAM HCL 5 MG/5ML IJ SOLN
INTRAMUSCULAR | Status: DC | PRN
  Administered 2017-07-25: 2 mg via INTRAVENOUS

## 2017-07-25 MED ORDER — VITAMIN C 500 MG PO TABS
1000.0000 mg | ORAL_TABLET | Freq: Every day | ORAL | Status: DC
  Administered 2017-07-25 – 2017-07-26 (×2): 1000 mg via ORAL
  Filled 2017-07-25 (×2): qty 2

## 2017-07-25 MED ORDER — SODIUM CHLORIDE 0.9% FLUSH
3.0000 mL | Freq: Two times a day (BID) | INTRAVENOUS | Status: DC
  Administered 2017-07-25 – 2017-07-26 (×2): 3 mL via INTRAVENOUS

## 2017-07-25 MED ORDER — DEXAMETHASONE SODIUM PHOSPHATE 10 MG/ML IJ SOLN
INTRAMUSCULAR | Status: DC | PRN
  Administered 2017-07-25: 10 mg via INTRAVENOUS

## 2017-07-25 MED ORDER — PHENOL 1.4 % MT LIQD: 1.0000 | OROMUCOSAL | Status: DC | PRN

## 2017-07-25 MED ORDER — LACTATED RINGERS IV SOLN: INTRAVENOUS | Status: DC

## 2017-07-25 MED ORDER — FENTANYL CITRATE (PF) 100 MCG/2ML IJ SOLN
25.0000 ug | INTRAMUSCULAR | Status: DC | PRN
  Administered 2017-07-25: 50 ug via INTRAVENOUS

## 2017-07-25 MED ORDER — BUPIVACAINE HCL (PF) 0.5 % IJ SOLN
INTRAMUSCULAR | Status: AC
  Filled 2017-07-25: qty 30

## 2017-07-25 MED ORDER — HEMOSTATIC AGENTS (NO CHARGE) OPTIME
TOPICAL | Status: DC | PRN
  Administered 2017-07-25: 1 via TOPICAL

## 2017-07-25 MED ORDER — METHOCARBAMOL 500 MG PO TABS
750.0000 mg | ORAL_TABLET | Freq: Four times a day (QID) | ORAL | Status: DC
  Administered 2017-07-25 – 2017-07-26 (×5): 750 mg via ORAL
  Filled 2017-07-25 (×5): qty 2

## 2017-07-25 MED ORDER — PHENYLEPHRINE 40 MCG/ML (10ML) SYRINGE FOR IV PUSH (FOR BLOOD PRESSURE SUPPORT)
PREFILLED_SYRINGE | INTRAVENOUS | Status: DC | PRN
  Administered 2017-07-25: 80 ug via INTRAVENOUS
  Administered 2017-07-25: 120 ug via INTRAVENOUS
  Administered 2017-07-25: 160 ug via INTRAVENOUS
  Administered 2017-07-25: 40 ug via INTRAVENOUS

## 2017-07-25 MED ORDER — ONDANSETRON HCL 4 MG/2ML IJ SOLN
INTRAMUSCULAR | Status: DC | PRN
  Administered 2017-07-25: 4 mg via INTRAVENOUS

## 2017-07-25 MED ORDER — CARVEDILOL 25 MG PO TABS
25.0000 mg | ORAL_TABLET | Freq: Two times a day (BID) | ORAL | Status: DC
  Administered 2017-07-25 – 2017-07-26 (×2): 25 mg via ORAL
  Filled 2017-07-25 (×2): qty 1

## 2017-07-25 MED ORDER — ROCURONIUM BROMIDE 10 MG/ML (PF) SYRINGE
PREFILLED_SYRINGE | INTRAVENOUS | Status: DC | PRN
  Administered 2017-07-25: 10 mg via INTRAVENOUS
  Administered 2017-07-25: 20 mg via INTRAVENOUS
  Administered 2017-07-25: 40 mg via INTRAVENOUS
  Administered 2017-07-25 (×2): 10 mg via INTRAVENOUS

## 2017-07-25 MED ORDER — PHENYLEPHRINE HCL 10 MG/ML IJ SOLN
INTRAVENOUS | Status: DC | PRN
  Administered 2017-07-25: 20 ug/min via INTRAVENOUS

## 2017-07-25 MED ORDER — DIAZEPAM 5 MG PO TABS
5.0000 mg | ORAL_TABLET | Freq: Four times a day (QID) | ORAL | Status: DC | PRN
  Filled 2017-07-25: qty 1

## 2017-07-25 SURGICAL SUPPLY — 71 items
BAG DECANTER FOR FLEXI CONT (MISCELLANEOUS) ×3 IMPLANT
BENZOIN TINCTURE PRP APPL 2/3 (GAUZE/BANDAGES/DRESSINGS) IMPLANT
BLADE CLIPPER SURG (BLADE) ×3 IMPLANT
BLADE SURG 11 STRL SS (BLADE) ×3 IMPLANT
BUR MATCHSTICK NEURO 3.0 LAGG (BURR) ×3 IMPLANT
BUR ROUND FLUTED 5 RND (BURR) ×2 IMPLANT
BUR ROUND FLUTED 5MM RND (BURR) ×1
CANISTER SUCT 3000ML PPV (MISCELLANEOUS) ×6 IMPLANT
CARTRIDGE OIL MAESTRO DRILL (MISCELLANEOUS) ×1 IMPLANT
CHLORAPREP W/TINT 26ML (MISCELLANEOUS) ×3 IMPLANT
CLOSURE WOUND 1/2 X4 (GAUZE/BANDAGES/DRESSINGS)
CONT SPEC 4OZ CLIKSEAL STRL BL (MISCELLANEOUS) ×3 IMPLANT
DECANTER SPIKE VIAL GLASS SM (MISCELLANEOUS) ×3 IMPLANT
DERMABOND ADVANCED (GAUZE/BANDAGES/DRESSINGS) ×2
DERMABOND ADVANCED .7 DNX12 (GAUZE/BANDAGES/DRESSINGS) ×1 IMPLANT
DIFFUSER DRILL AIR PNEUMATIC (MISCELLANEOUS) ×3 IMPLANT
DRAPE MICROSCOPE LEICA (MISCELLANEOUS) ×3 IMPLANT
DRAPE POUCH INSTRU U-SHP 10X18 (DRAPES) ×3 IMPLANT
DRAPE SURG 17X23 STRL (DRAPES) ×3 IMPLANT
DRSG OPSITE POSTOP 4X8 (GAUZE/BANDAGES/DRESSINGS) ×3 IMPLANT
ELECT BLADE 4.0 EZ CLEAN MEGAD (MISCELLANEOUS) ×3
ELECT COATED BLADE 2.86 ST (ELECTRODE) ×3 IMPLANT
ELECT REM PT RETURN 9FT ADLT (ELECTROSURGICAL) ×3
ELECTRODE BLDE 4.0 EZ CLN MEGD (MISCELLANEOUS) ×1 IMPLANT
ELECTRODE REM PT RTRN 9FT ADLT (ELECTROSURGICAL) ×1 IMPLANT
GAUZE SPONGE 4X4 12PLY STRL (GAUZE/BANDAGES/DRESSINGS) IMPLANT
GAUZE SPONGE 4X4 16PLY XRAY LF (GAUZE/BANDAGES/DRESSINGS) IMPLANT
GLOVE BIO SURGEON STRL SZ7.5 (GLOVE) ×3 IMPLANT
GLOVE BIOGEL PI IND STRL 7.5 (GLOVE) ×2 IMPLANT
GLOVE BIOGEL PI INDICATOR 7.5 (GLOVE) ×4
GLOVE INDICATOR 7.5 STRL GRN (GLOVE) ×3 IMPLANT
GLOVE SS BIOGEL STRL SZ 7.5 (GLOVE) ×2 IMPLANT
GLOVE SUPERSENSE BIOGEL SZ 7.5 (GLOVE) ×4
GOWN STRL REUS W/ TWL LRG LVL3 (GOWN DISPOSABLE) ×1 IMPLANT
GOWN STRL REUS W/ TWL XL LVL3 (GOWN DISPOSABLE) ×1 IMPLANT
GOWN STRL REUS W/TWL LRG LVL3 (GOWN DISPOSABLE) ×2
GOWN STRL REUS W/TWL XL LVL3 (GOWN DISPOSABLE) ×2
HEMOSTAT POWDER KIT SURGIFOAM (HEMOSTASIS) ×3 IMPLANT
KIT BASIN OR (CUSTOM PROCEDURE TRAY) ×3 IMPLANT
KIT ROOM TURNOVER OR (KITS) ×3 IMPLANT
NEEDLE HYPO 18GX1.5 BLUNT FILL (NEEDLE) ×3 IMPLANT
NEEDLE HYPO 21X1.5 SAFETY (NEEDLE) ×6 IMPLANT
NEEDLE SPNL 22GX3.5 QUINCKE BK (NEEDLE) ×6 IMPLANT
NS IRRIG 1000ML POUR BTL (IV SOLUTION) ×3 IMPLANT
OIL CARTRIDGE MAESTRO DRILL (MISCELLANEOUS) ×3
PACK LAMINECTOMY NEURO (CUSTOM PROCEDURE TRAY) ×3 IMPLANT
PACK UNIVERSAL I (CUSTOM PROCEDURE TRAY) ×3 IMPLANT
PAD ARMBOARD 7.5X6 YLW CONV (MISCELLANEOUS) ×9 IMPLANT
PATTIES SURGICAL .5X1.5 (GAUZE/BANDAGES/DRESSINGS) ×3 IMPLANT
RUBBERBAND STERILE (MISCELLANEOUS) ×6 IMPLANT
SPONGE NEURO XRAY DETECT 1X3 (DISPOSABLE) ×3 IMPLANT
SPONGE SURGIFOAM ABS GEL SZ50 (HEMOSTASIS) ×3 IMPLANT
STRIP CLOSURE SKIN 1/2X4 (GAUZE/BANDAGES/DRESSINGS) IMPLANT
SUT MNCRL 3 0 RB1 (SUTURE) ×1 IMPLANT
SUT MONOCRYL 3 0 RB1 (SUTURE) ×2
SUT STRATAFIX MNCRL+ 3-0 PS-2 (SUTURE)
SUT STRATAFIX MONOCRYL 3-0 (SUTURE)
SUT VIC AB 0 CT1 18XCR BRD8 (SUTURE) IMPLANT
SUT VIC AB 0 CT1 8-18 (SUTURE)
SUT VIC AB 1 CT1 18XBRD ANBCTR (SUTURE) ×2 IMPLANT
SUT VIC AB 1 CT1 8-18 (SUTURE) ×4
SUT VIC AB 2-0 CT1 18 (SUTURE) ×6 IMPLANT
SUT VIC AB 4-0 PS2 27 (SUTURE) IMPLANT
SUTURE STRATFX MNCRL+ 3-0 PS-2 (SUTURE) IMPLANT
SYR 30ML LL (SYRINGE) ×6 IMPLANT
SYR 5ML LL (SYRINGE) ×3 IMPLANT
TOWEL GREEN STERILE (TOWEL DISPOSABLE) ×3 IMPLANT
TOWEL GREEN STERILE FF (TOWEL DISPOSABLE) ×3 IMPLANT
TUBE CONNECTING 12'X1/4 (SUCTIONS) ×1
TUBE CONNECTING 12X1/4 (SUCTIONS) ×2 IMPLANT
WATER STERILE IRR 1000ML POUR (IV SOLUTION) ×3 IMPLANT

## 2017-07-25 NOTE — Transfer of Care (Signed)
Immediate Anesthesia Transfer of Care Note  Patient: Tyler Mckinney  Procedure(s) Performed: Lumbar three-four Laminectomy with left discectomy (N/A )  Patient Location: PACU  Anesthesia Type:General  Level of Consciousness: awake, alert  and oriented  Airway & Oxygen Therapy: Patient Spontanous Breathing and Patient connected to face mask oxygen  Post-op Assessment: Report given to RN, Post -op Vital signs reviewed and stable and Patient moving all extremities X 4  Post vital signs: Reviewed and stable  Last Vitals:  Vitals:   07/25/17 1141 07/25/17 1155  BP: (!) 98/56 (!) 104/55  Pulse: 84 91  Resp: 16 15  Temp: 36.7 C   SpO2: 100% 95%    Last Pain:  Vitals:   07/25/17 1141  TempSrc:   PainSc: 0-No pain         Complications: No apparent anesthesia complications

## 2017-07-25 NOTE — H&P (Signed)
CC:  No chief complaint on file. Back and leg pain  HPI: Tyler Mckinney is a 63 y.o. male with back and leg pain, left greater than right.  He has a large disc herniation as well as lumbar stenosis due to spondylosis.  PMH: Past Medical History:  Diagnosis Date  . Allergy   . Aortic stenosis    mild AS by 01/12/17 echo [facility])  . Arthritis   . Atrial fibrillation (HCC) 08/21/2015  . CHF (congestive heart failure) (HCC)    chronic systolic CHF  . Chronic pain syndrome 02/04/2017  . Degenerative disc disease, lumbar 01/29/2013  . History of kidney stones 1992  . HTN (hypertension) 08/21/2015  . Hypertension   . Insomnia 08/21/2015  . Low back pain 02/04/2017  . Morbid obesity (HCC) 02/04/2017  . OSA (obstructive sleep apnea) 08/21/2015  . Pain in knee joint 11/30/2012  . Primary osteoarthritis of both knees 12/28/2012  . Pulmonary hypertension (HCC)    severe pulmonary hypertension, RVSP 84 mmHg per 01/12/17 echo [facility])  . Sleep apnea     PSH: Past Surgical History:  Procedure Laterality Date  . FRACTURE SURGERY     Left Big Toes  . HERNIA REPAIR    . LEG SURGERY Left    wound debridement    SH: Social History   Tobacco Use  . Smoking status: Never Smoker  . Smokeless tobacco: Never Used  Substance Use Topics  . Alcohol use: No    Alcohol/week: 0.0 oz  . Drug use: No    MEDS: Prior to Admission medications   Medication Sig Start Date End Date Taking? Authorizing Provider  acetaminophen (TYLENOL) 500 MG tablet Take 1,000 mg by mouth every 6 (six) hours as needed (for pain/headaches.).    Yes [provider]  apixaban (ELIQUIS) 5 MG TABS tablet Take 1 tablet (5 mg total) by mouth 2 (two) times daily. 02/24/17  Yes Baldo Daub, MD  Ascorbic Acid (VITAMIN C) 1000 MG tablet Take 1,000 mg by mouth daily.   Yes [provider]  carvedilol (COREG) 25 MG tablet Take 25 mg by mouth 2 (two) times daily.   Yes [provider]  cloNIDine  (CATAPRES) 0.3 MG tablet Take 1 tablet (0.3 mg total) by mouth at bedtime. 02/24/17  Yes Baldo Daub, MD  cyclobenzaprine (FLEXERIL) 10 MG tablet Take 10 mg by mouth 3 (three) times daily as needed. For muscle spasm/pain. 06/16/17  Yes [provider]  DULoxetine (CYMBALTA) 60 MG capsule Take 60 mg by mouth daily.   Yes [provider]  furosemide (LASIX) 40 MG tablet Take 80 mg by mouth daily.    Yes [provider]  losartan (COZAAR) 50 MG tablet Take 25 mg by mouth daily.  01/31/17  Yes [provider]  potassium chloride (K-DUR,KLOR-CON) 10 MEQ tablet Take 10 mEq by mouth daily as needed (for cramping).    Yes [provider]  testosterone cypionate (DEPOTESTOSTERONE CYPIONATE) 200 MG/ML injection Inject 100 mg into the muscle every 14 (fourteen) days. 06/17/17  Yes [provider]    ALLERGY: Allergies  Allergen Reactions  . Penicillins Anaphylaxis    Has patient had a PCN reaction causing immediate rash, facial/tongue/throat swelling, SOB or lightheadedness with hypotension: Yes Has patient had a PCN reaction causing severe rash involving mucus membranes or skin necrosis: Yes Has patient had a PCN reaction that required hospitalization: No Has patient had a PCN reaction occurring within the last 10 years:  No If all of the above answers are "NO", then may proceed with Cephalosporin use.   . Tetanus Toxoids Anaphylaxis  . Hydralazine Other (See Comments)    Caused a-fib  . Isosorbide Nitrate Rash    Severe rash     ROS: ROS  NEUROLOGIC EXAM: Awake, alert, oriented Memory and concentration grossly intact Speech fluent, appropriate CN grossly intact Motor exam: Upper Extremities Deltoid Bicep Tricep Grip  Right 5/5 5/5 5/5 5/5  Left 5/5 5/5 5/5 5/5   Lower Extremity IP Quad PF DF EHL  Right 5/5 5/5 5/5 5/5 5/5  Left 5/5 5/5 3/5 3/5 3/5   Sensation grossly intact to LT  IMAGING: No new imaging  IMPRESSION: - 62  y.o. male with a left L3-4 HNP and lumbar spondylosis.  He has deficits as above.  PLAN: - L3-4 laminectomy for decompression with left L3-4 discectomy - We have discussed the details of the surgery including risks, benefits, and alternatives and he wishes to proceed.

## 2017-07-25 NOTE — Progress Notes (Signed)
Pharmacy Antibiotic Note  Tyler Mckinney is a 62 y.o. male admitted on 07/25/2017 with back and leg pain. Pharmacy has been consulted for vancomycin dosing post-op for surgical prophylaxis. Pt does not have a drain in place.   Plan: Vancomycin 1500mg  IV x 1 tonight, approx 12 hours after pre-op dose *Pharmacy will sign off. Thank you for the consult!     Temp (24hrs), Avg:98 F (36.7 C), Min:97.7 F (36.5 C), Max:98.1 F (36.7 C)  Recent Labs  Lab 07/21/17 1355  WBC 10.3  CREATININE 1.17    Estimated Creatinine Clearance: 103.7 mL/min (by C-G formula based on SCr of 1.17 mg/dL).    Allergies  Allergen Reactions  . Penicillins Anaphylaxis    Has patient had a PCN reaction causing immediate rash, facial/tongue/throat swelling, SOB or lightheadedness with hypotension: Yes Has patient had a PCN reaction causing severe rash involving mucus membranes or skin necrosis: Yes Has patient had a PCN reaction that required hospitalization: No Has patient had a PCN reaction occurring within the last 10 years: No If all of the above answers are "NO", then may proceed with Cephalosporin use.   . Tetanus Toxoids Anaphylaxis  . Hydralazine Other (See Comments)    Caused a-fib  . Isosorbide Nitrate Rash    Severe rash     Thank you for allowing pharmacy to be a part of this patient's care.  Valdez Brannan, Drake Leach 07/25/2017 2:02 PM

## 2017-07-25 NOTE — Op Note (Signed)
07/25/2017  11:20 AM  PATIENT:  Tyler Mckinney  62 y.o. male  PRE-OPERATIVE DIAGNOSIS:  Lumbosacral spondylosis with radiculopathy; lumbar stenosis L3-4; herniated nucleus pulposis, left L3-4; morbid obesity  POST-OPERATIVE DIAGNOSIS:  Same  PROCEDURE:  L3-4 laminectomy for decompression with left sided discectomy; microsurgical technique requiring use of the operating microscope  SURGEON:  Hulan Saas, MD  ASSISTANTS: Cindra Presume, PA-C  ANESTHESIA:   General  DRAINS: None   SPECIMEN:  None  INDICATION FOR PROCEDURE: 62 year old male with intractable leg pain, left greater than right.  I recommended the above procedure.  The patient understood the risks, benefits, and alternatives and potential outcomes and wished to proceed.  PROCEDURE DETAILS: After smooth induction of general endotracheal anesthesia the patient was turned prone on the operating table on a Wilson frame. The skin of the lumbar region was clipped of hair. It was wiped down with alcohol. The patient was then prepped and draped in usual sterile fashion.   The subcutaneous tissues of the midline from approximately L3 to L4 was infiltrated with Marcaine. The skin in this area was opened sharply. The soft tissues were dissected with monopolar cautery. Subperiosteal dissection was carried out along the sides of the spinous processes and the laminar surfaces to the medial edge of the facet joints from L3 to L4. The spinous processes were removed with rongeurs. The laminae were then thinned with a high-speed bur. The remaining lamina was resected with a Kerrison punch. Thickened ligamentum flavum was separated from the dura and resected with a Kerrison rongeur. The thecal sac was further freed from the hypertrophied ligament in the lateral recesses.  Lateral recess decompression was completed. Decompression was carried out laterally to the foramina. The foramina were palpated and found to be without residual  stenosis.  The microscope was draped and brought into the field to provide light and magnification.  Utilizing microsurgical technique the dura was medialized from the left side.  A large subligamentous disc herniation was identified.  My assistant held the dura back with a nerve root retractor.  The epidural veins were cauterized with bipolar cautery.  The posterior longitudinal ligament was incised sharply.  Disc material was identified and was broken apart with a nerve hook.  The nerve hook, ball hooks, and the pituitary rongeur were used to remove the entirety of the disc herniation.  I palpated along the ventral surface of the thecal sac and was satisfied there was no additional stenosis.  I irrigated vigorously with bacitracin saline. There was excellent hemostasis. I placed vancomycin powder in the depths of the wounds.The wound was then closed in routine anatomic layers with interrupted vicryl suture suture. I injected exparel, marcaine, and saline in the paraspinous muscles. The skin was closed with a running Monocryl strata fix suture. It was then sealed with Dermabond. The patient was turned to the supine position and allowed to awaken from anesthesia.   PATIENT DISPOSITION:  PACU - hemodynamically stable.   Delay start of Pharmacological VTE agent (>24hrs) due to surgical blood loss or risk of bleeding:  yes

## 2017-07-25 NOTE — Anesthesia Procedure Notes (Signed)
Procedure Name: Intubation Date/Time: 07/25/2017 9:20 AM Performed by: Nils Pyle, CRNA Pre-anesthesia Checklist: Patient identified, Emergency Drugs available, Suction available and Patient being monitored Patient Re-evaluated:Patient Re-evaluated prior to induction Oxygen Delivery Method: Circle System Utilized Preoxygenation: Pre-oxygenation with 100% oxygen Induction Type: IV induction and Rapid sequence Ventilation: Oral airway inserted - appropriate to patient size, Mask ventilation with difficulty and Two handed mask ventilation required Laryngoscope Size: Miller and 2 Grade View: Grade I Tube type: Oral Tube size: 7.5 mm Number of attempts: 1 Airway Equipment and Method: Stylet and Oral airway Placement Confirmation: ETT inserted through vocal cords under direct vision,  positive ETCO2 and breath sounds checked- equal and bilateral Secured at: 23 cm Tube secured with: Tape Dental Injury: Teeth and Oropharynx as per pre-operative assessment

## 2017-07-25 NOTE — Anesthesia Preprocedure Evaluation (Signed)
Anesthesia Evaluation  Patient identified by MRN, date of birth, ID band Patient awake    Reviewed: Allergy & Precautions, NPO status , Patient's Chart, lab work & pertinent test results  Airway Mallampati: III  TM Distance: >3 FB Neck ROM: Full    Dental  (+) Teeth Intact, Dental Advisory Given   Pulmonary    breath sounds clear to auscultation       Cardiovascular hypertension,  Rhythm:Regular Rate:Normal     Neuro/Psych    GI/Hepatic   Endo/Other    Renal/GU      Musculoskeletal   Abdominal   Peds  Hematology   Anesthesia Other Findings   Reproductive/Obstetrics                             Anesthesia Physical Anesthesia Plan  ASA: III  Anesthesia Plan: General   Post-op Pain Management:    Induction: Intravenous  PONV Risk Score and Plan: Ondansetron and Dexamethasone  Airway Management Planned: Oral ETT  Additional Equipment:   Intra-op Plan:   Post-operative Plan: Extubation in OR  Informed Consent: I have reviewed the patients History and Physical, chart, labs and discussed the procedure including the risks, benefits and alternatives for the proposed anesthesia with the patient or authorized representative who has indicated his/her understanding and acceptance.   Dental advisory given  Plan Discussed with: CRNA and Anesthesiologist  Anesthesia Plan Comments: (Obesity  Chronic Afib Sleep apnea not using CPAP Pulmonary Htn, RV enlargement  By echo H/O CHF EF 30-35% by echo, no history of CAD  Kipp Brood)        Anesthesia Quick Evaluation

## 2017-07-25 NOTE — Anesthesia Postprocedure Evaluation (Signed)
Anesthesia Post Note  Patient: Tyler Mckinney  Procedure(s) Performed: Lumbar three-four Laminectomy with left discectomy (N/A )     Anesthesia Type: General Level of consciousness: awake, awake and alert and oriented Pain management: pain level controlled Vital Signs Assessment: post-procedure vital signs reviewed and stable Respiratory status: spontaneous breathing, nonlabored ventilation and respiratory function stable Cardiovascular status: blood pressure returned to baseline Anesthetic complications: no    Last Vitals:  Vitals:   07/25/17 1254 07/25/17 1330  BP: 102/60 (!) 143/90  Pulse: 84 93  Resp: 18 (!) 22  Temp: 36.7 C 36.5 C  SpO2: 95% 92%    Last Pain:  Vitals:   07/25/17 1330  TempSrc: Oral  PainSc: 3                  Destynie Toomey COKER

## 2017-07-26 ENCOUNTER — Other Ambulatory Visit: Payer: Self-pay

## 2017-07-26 ENCOUNTER — Encounter (HOSPITAL_COMMUNITY): Payer: Self-pay | Admitting: Neurological Surgery

## 2017-07-26 DIAGNOSIS — I350 Nonrheumatic aortic (valve) stenosis: Secondary | ICD-10-CM | POA: Diagnosis not present

## 2017-07-26 DIAGNOSIS — M4727 Other spondylosis with radiculopathy, lumbosacral region: Secondary | ICD-10-CM | POA: Diagnosis not present

## 2017-07-26 DIAGNOSIS — M48061 Spinal stenosis, lumbar region without neurogenic claudication: Secondary | ICD-10-CM | POA: Diagnosis not present

## 2017-07-26 DIAGNOSIS — Z6841 Body Mass Index (BMI) 40.0 and over, adult: Secondary | ICD-10-CM | POA: Diagnosis not present

## 2017-07-26 DIAGNOSIS — M5116 Intervertebral disc disorders with radiculopathy, lumbar region: Secondary | ICD-10-CM | POA: Diagnosis not present

## 2017-07-26 MED ORDER — METHOCARBAMOL 750 MG PO TABS: 750.0000 mg | ORAL_TABLET | Freq: Four times a day (QID) | ORAL | 2 refills | Status: AC

## 2017-07-26 MED ORDER — GABAPENTIN 300 MG PO CAPS: 300.0000 mg | ORAL_CAPSULE | Freq: Three times a day (TID) | ORAL | 2 refills | Status: AC

## 2017-07-26 MED ORDER — OXYCODONE-ACETAMINOPHEN 7.5-325 MG PO TABS: 1.0000 | ORAL_TABLET | ORAL | 0 refills | Status: AC | PRN

## 2017-07-26 MED ORDER — APIXABAN 5 MG PO TABS: 5.0000 mg | ORAL_TABLET | Freq: Two times a day (BID) | ORAL | 11 refills | Status: AC

## 2017-07-26 NOTE — Evaluation (Signed)
Physical Therapy Evaluation Patient Details Name: Tyler Mckinney MRN: [id number] DOB: 1956/01/15 Today's Date: 07/26/2017   History of Present Illness  62 y.o. male s/p L3-4 laminectomy with left-sided discectomy. PMH significant of OA in bilateral knees, pulmonary HTN, HTN, OSA, insomnia, DDD, LBP, CHF, and A-fib.   Clinical Impression  Pt presents with decreased strength, decreased balance, decreased tolerance for activity, pain of left lower extremity and low back, impaired sensation of LLE, and impaired mobility secondary to above. Pt and pt's nurse report ~100 ft of ambulation with RW with nurse before PT arrival, thus pt declined ambulation. However pt amenable to climbing one stair and able to climb one stair with supervision from PT. Pt lives in "tiny home" and has 24/7 assistance from family who live next door. Pt demonstrates all transfers with supervision and increased time. Pt c/o dizziness upon first standing, which improved with time. BP seated: 131/86, BP standing: 116/81. Educated pt on back precautions and pt able to recall 3/3 precautions. Pt would benefit from outpatient PT upon discharge from hospital in order to improve mobility status and decrease pain. PT will follow acutely while in hospital setting in order to maximize mobility while in hospital setting.     Follow Up Recommendations Outpatient PT;Supervision for mobility/OOB    Equipment Recommendations  Rolling walker with 5" wheels    Recommendations for Other Services       Precautions / Restrictions Precautions Precautions: Fall;Back Precaution Comments: Reviewed 3/3 back precautions.  Restrictions Weight Bearing Restrictions: No      Mobility  Bed Mobility               General bed mobility comments: Pt sitting EOB upon PT arrival.   Transfers Overall transfer level: Needs assistance Equipment used: Straight cane Transfers: Sit to/from Stand;Stand Pivot Transfers Sit to Stand: Supervision Stand  pivot transfers: Supervision       General transfer comment: Pt performs SPT from EOB to chair with increased time and notes some dizziness upon standing. STS x2 from chair with increased time. PT in chair post-session.   Ambulation/Gait             General Gait Details: Pt declines gait at this time, as he has just walked with nursing (~100 ft with RW) and is fatigued.   Stairs Stairs: Yes Stairs assistance: Supervision Stair Management: One rail Right;Step to pattern;Forwards;Backwards Number of Stairs: 1 General stair comments: Pt ascends stairs forwards holding onto railing on right and cane with LUE. Pt descends stairs backwards with cane in LUE and right UE on railing. Pt requiring increased time and effort.   Wheelchair Mobility    Modified Rankin (Stroke Patients Only)       Balance Overall balance assessment: Needs assistance Sitting-balance support: No upper extremity supported;Feet supported Sitting balance-Leahy Scale: Good Sitting balance - Comments: Pt demonstrates ability to sit EOB and utilize BUEs to wash hair with hospital shower cap. Pt without LOB.      Standing balance-Leahy Scale: Fair Standing balance comment: Pt able to stand statically without external support for short periods of time only. Pt requiring support for dynamic standing balance.                              Pertinent Vitals/Pain Pain Assessment: 0-10 Pain Score: 5  Pain Location: left LE Pain Descriptors / Indicators: Aching Pain Intervention(s): Limited activity within patient's tolerance    Home Living Family/patient expects  to be discharged to:: Private residence Living Arrangements: Alone Available Help at Discharge: Family;Friend(s);Available 24 hours/day Type of Home: House("tiny home") Home Access: Stairs to enter Entrance Stairs-Rails: None Entrance Stairs-Number of Steps: 1 Home Layout: One level Home Equipment: Other (comment);Cane - single  point(rollator)      Prior Function Level of Independence: Needs assistance   Gait / Transfers Assistance Needed: Pt utilizes a cane within household and rollator for ambulation in the community.      Comments: Pt requiring assist in taking care of ulcers on LEs and has been getting help from nephew's wife, Ander Slade, who lives next door.      Hand Dominance        Extremity/Trunk Assessment   Upper Extremity Assessment Upper Extremity Assessment: Defer to OT evaluation    Lower Extremity Assessment Lower Extremity Assessment: Generalized weakness;LLE deficits/detail LLE Deficits / Details: Pt reports that he has not felt sensation in left anterior thigh area for some time now, however he is beginning to gain back sensation since the surgery.  LLE Sensation: decreased light touch    Cervical / Trunk Assessment Cervical / Trunk Assessment: Kyphotic  Communication   Communication: No difficulties  Cognition Arousal/Alertness: Awake/alert Behavior During Therapy: WFL for tasks assessed/performed Overall Cognitive Status: Within Functional Limits for tasks assessed                                        General Comments General comments (skin integrity, edema, etc.): Pt reports that his nephew and nephew's wife live next door and are available 24/7 to assist. Pt lives in "tiny home" and has found ways of getting around using furniture and DME. Educated pt on back precautions and pt able to repeat back 3/3 precautions.  BP seated 131/86, BP standing 116/81.    Exercises     Assessment/Plan    PT Assessment Patient needs continued PT services  PT Problem List Decreased strength;Decreased mobility;Decreased activity tolerance;Decreased balance;Impaired sensation;Pain;Cardiopulmonary status limiting activity       PT Treatment Interventions DME instruction;Therapeutic activities;Gait training;Therapeutic exercise;Patient/family education;Stair training;Balance  training;Functional mobility training;Neuromuscular re-education    PT Goals (Current goals can be found in the Care Plan section)  Acute Rehab PT Goals Patient Stated Goal: to go home PT Goal Formulation: With patient Time For Goal Achievement: 08/09/17 Potential to Achieve Goals: Good    Frequency Min 5X/week   Barriers to discharge        Co-evaluation               AM-PAC PT "6 Clicks" Daily Activity  Outcome Measure Difficulty turning over in bed (including adjusting bedclothes, sheets and blankets)?: A Little Difficulty moving from lying on back to sitting on the side of the bed? : A Little Difficulty sitting down on and standing up from a chair with arms (e.g., wheelchair, bedside commode, etc,.)?: A Little Help needed moving to and from a bed to chair (including a wheelchair)?: A Little Help needed walking in hospital room?: A Little Help needed climbing 3-5 steps with a railing? : A Little 6 Click Score: 18    End of Session   Activity Tolerance: Patient limited by fatigue Patient left: in chair;with call bell/phone within reach   PT Visit Diagnosis: Difficulty in walking, not elsewhere classified (R26.2);Muscle weakness (generalized) (M62.81)    Time: 4098-1191 PT Time Calculation (min) (ACUTE ONLY): 27 min  Charges:   PT Evaluation $PT Eval Moderate Complexity: 1 Mod PT Treatments $Therapeutic Activity: 8-22 mins   PT G Codes:        Reina Fuse, SPT  Reina Fuse 07/26/2017, 11:53 AM

## 2017-07-26 NOTE — Progress Notes (Signed)
   07/26/17 1300  Clinical Encounter Type  Visited With Patient  Visit Type Follow-up  Referral From Nurse  Consult/Referral To Chaplain  Spiritual Encounters  Spiritual Needs Literature  Stress Factors  Patient Stress Factors Health changes  Family Stress Factors None identified  Chaplain was called to the room for an AD to be completed.  The PT was available to have the AD completed and just needed to make sure that the paperwork was notarized before release from the hospital.

## 2017-07-26 NOTE — Discharge Summary (Signed)
Physician Discharge Summary  Patient ID: Tyler Mckinney MRN: [id number] DOB/AGE: 11/20/1955 62 y.o.  Admit date: 07/25/2017 Discharge date: 07/26/2017  Admission Diagnoses:  Lumbar HNP  Discharge Diagnoses:  Same Active Problems:   Lumbar disc herniation with radiculopathy   Discharged Condition: Stable  Hospital Course:  Tyler Mckinney is a 62 y.o. male who was admitted for the below procedure. There were no post operative complications. At time of discharge, pain was well controlled, ambulating with Pt/OT, tolerating po, voiding normal. Ready for discharge.  Treatments: Surgery L3-4 laminectomy for decompression with left sided discectomy; microsurgical technique requiring use of the operating microscope    Discharge Exam: Blood pressure 131/80, pulse 92, temperature 97.6 F (36.4 C), resp. rate 20, height 5\' 10"  (1.778 m), weight (!) 168.2 kg (370 lb 13 oz), SpO2 93 %. Awake, alert, oriented Speech fluent, appropriate CN grossly intact MAEW with good strength Wound c/d/i  Disposition: Final discharge disposition not confirmed  Discharge Instructions    Call MD for:  difficulty breathing, headache or visual disturbances   Complete by:  As directed    Call MD for:  persistant dizziness or light-headedness   Complete by:  As directed    Call MD for:  redness, tenderness, or signs of infection (pain, swelling, redness, odor or green/yellow discharge around incision site)   Complete by:  As directed    Call MD for:  severe uncontrolled pain   Complete by:  As directed    Call MD for:  temperature >100.4   Complete by:  As directed    Diet general   Complete by:  As directed    Driving Restrictions   Complete by:  As directed    Do not drive until given clearance.   Increase activity slowly   Complete by:  As directed    Lifting restrictions   Complete by:  As directed    Do not lift anything >10lbs. Avoid bending and twisting in awkward positions. Avoid bending at  the back.   May shower / Bathe   Complete by:  As directed    In 24 hours. Okay to wash wound with warm soapy water. Avoid scrubbing the wound. Pat dry.   Remove dressing in 24 hours   Complete by:  As directed      Allergies as of 07/26/2017      Reactions   Penicillins Anaphylaxis   Has patient had a PCN reaction causing immediate rash, facial/tongue/throat swelling, SOB or lightheadedness with hypotension: Yes Has patient had a PCN reaction causing severe rash involving mucus membranes or skin necrosis: Yes Has patient had a PCN reaction that required hospitalization: No Has patient had a PCN reaction occurring within the last 10 years: No If all of the above answers are "NO", then may proceed with Cephalosporin use.   Tetanus Toxoids Anaphylaxis   Hydralazine Other (See Comments)   Caused a-fib   Isosorbide Nitrate Rash   Severe rash      Medication List    STOP taking these medications   cyclobenzaprine 10 MG tablet Commonly known as:  FLEXERIL     TAKE these medications   acetaminophen 500 MG tablet Commonly known as:  TYLENOL Take 1,000 mg by mouth every 6 (six) hours as needed (for pain/headaches.).   apixaban 5 MG Tabs tablet Commonly known as:  ELIQUIS Take 1 tablet (5 mg total) by mouth 2 (two) times daily. Start taking on:  07/30/2017 What changed:  These instructions start on 07/30/2017.  If you are unsure what to do until then, ask your doctor or other care provider.   carvedilol 25 MG tablet Commonly known as:  COREG Take 25 mg by mouth 2 (two) times daily.   cloNIDine 0.3 MG tablet Commonly known as:  CATAPRES Take 1 tablet (0.3 mg total) by mouth at bedtime.   DULoxetine 60 MG capsule Commonly known as:  CYMBALTA Take 60 mg by mouth daily.   furosemide 40 MG tablet Commonly known as:  LASIX Take 80 mg by mouth daily.   gabapentin 300 MG capsule Commonly known as:  NEURONTIN Take 1 capsule (300 mg total) by mouth 3 (three) times daily.    losartan 50 MG tablet Commonly known as:  COZAAR Take 25 mg by mouth daily.   methocarbamol 750 MG tablet Commonly known as:  ROBAXIN Take 1 tablet (750 mg total) by mouth 4 (four) times daily.   oxyCODONE-acetaminophen 7.5-325 MG tablet Commonly known as:  PERCOCET Take 1 tablet by mouth every 4 (four) hours as needed for severe pain.   potassium chloride 10 MEQ tablet Commonly known as:  K-DUR,KLOR-CON Take 10 mEq by mouth daily as needed (for cramping).   testosterone cypionate 200 MG/ML injection Commonly known as:  DEPOTESTOSTERONE CYPIONATE Inject 100 mg into the muscle every 14 (fourteen) days.   vitamin C 1000 MG tablet Take 1,000 mg by mouth daily.      Follow-up Information    Ditty, Loura Halt, MD Follow up.   Specialty:  Neurosurgery Contact information: [address] [place] [address] [phone]           Signed: Alyson Ingles 07/26/2017, 10:04 AM

## 2017-07-26 NOTE — Care Management Note (Signed)
Case Management Note  Patient Details  Name: Tyler Mckinney MRN: [id number] Date of Birth: 1955/10/03  Subjective/Objective:    62 y.o. male s/p L3-4 laminectomy with left-sided discectomy.   PTA, pt independent, lives alone, but on nephew's property.  He has a caregiver to assist him as needed.                  Action/Plan: PT recommended OP PT and RW for home.  Referral made to [facility] location of [facility] for OP follow up.  Referral to [facility] for DME needs.  RW to be delivered to pt's room prior to dc.    Expected Discharge Date:  07/26/17               Expected Discharge Plan:  OP Rehab  In-House Referral:     Discharge planning Services  CM Consult  Post Acute Care Choice:    Choice offered to:     DME Arranged:  Walker rolling DME Agency:  [facility]  HH Arranged:    [facility] Agency:     Status of Service:  Completed, signed off  If discussed at Long Length of Stay Meetings, dates discussed:    Additional Comments:  Glennon Mac, RN 07/26/2017, 1:59 PM

## 2017-07-26 NOTE — Evaluation (Signed)
Occupational Therapy Evaluation Patient Details Name: Tyler Mckinney MRN: [id number] DOB: 1956-03-18 Today's Date: 07/26/2017    History of Present Illness 62 y.o. male s/p L3-4 laminectomy with left-sided discectomy. PMH significant of OA in bilateral knees, pulmonary HTN, HTN, OSA, insomnia, DDD, LBP, CHF, and A-fib.    Clinical Impression   PTA, pt was living alone and was independent. Pt currently performing ADLs and functional mobility at [facility] level with RW. Provided education on back precautions, UB ADLs, LB ADLs with AE, toileting, and bed mobility. Answering all pt questions in preparation for dc later today. Recommend dc home once medically stable per phsyican. All acute OT need met.    Follow Up Recommendations  No OT follow up;Supervision/Assistance - 24 hour    Equipment Recommendations  None recommended by OT    Recommendations for Other Services PT consult     Precautions / Restrictions Precautions Precautions: Fall;Back Precaution Booklet Issued: Yes (comment) Precaution Comments: Reviewed 3/3 back precautions.  Required Braces or Orthoses: Other Brace/Splint Other Brace/Splint: No brace needed Restrictions Weight Bearing Restrictions: No      Mobility Bed Mobility               General bed mobility comments: Pt sitting EOB upon arrival.  Reviewed log roll technique to adhere to back precautions  Transfers Overall transfer level: Needs assistance Equipment used: Rolling walker (2 wheeled) Transfers: Sit to/from [facility] Sit to Stand: Supervision Stand pivot transfers: Supervision       General transfer comment: supervision for safety    Balance Overall balance assessment: Needs assistance Sitting-balance support: No upper extremity supported;Feet supported Sitting balance-Leahy Scale: Good Sitting balance - Comments: Pt demonstrates ability to sit EOB and utilize BUEs to wash hair with hospital shower cap. Pt without LOB.       Standing balance-Leahy Scale: Fair Standing balance comment: Pt able to stand statically without external support for short periods of time only. Pt requiring support for dynamic standing balance.                            ADL either performed or assessed with clinical judgement   ADL Overall ADL's : Needs assistance/impaired Eating/Feeding: Set up;Sitting   Grooming: Set up;Supervision/safety;Standing Grooming Details (indicate cue type and reason): Educated pt on compensatory techniques for oral care to adhere to back precautions Upper Body Bathing: Set up;Supervision/ safety;Sitting Upper Body Bathing Details (indicate cue type and reason): Educated pt on safe tehcniques for washing hair at sink during sponge bath Lower Body Bathing: Sit to/from stand;Min guard Lower Body Bathing Details (indicate cue type and reason): Educated pt on LB ADLs with AE. Pt verbalized understanding Upper Body Dressing : Set up;Supervision/safety;Sitting   Lower Body Dressing: Min guard;Sit to/from stand Lower Body Dressing Details (indicate cue type and reason): Educated pt on use of AE for LB ADLs. Pt doffing/donning socks with AE. Pt verbalized understanding of donning pants with AE Toilet Transfer: Min guard;Ambulation;RW(Simulated in recliner)           Functional mobility during ADLs: Min guard;Rolling walker General ADL Comments: Providing education on back precautions, UB ADLs, LB ADLs, and compensatory techniques.     Vision         Perception     Praxis      Pertinent Vitals/Pain Pain Assessment: 0-10 Pain Score: 7  Pain Location: Back Pain Descriptors / Indicators: Aching Pain Intervention(s): Monitored during session;Repositioned  Hand Dominance Right   Extremity/Trunk Assessment Upper Extremity Assessment Upper Extremity Assessment: Overall WFL for tasks assessed   Lower Extremity Assessment Lower Extremity Assessment: Defer to PT evaluation LLE  Deficits / Details: Pt reports that he has not felt sensation in left anterior thigh area for some time now, however he is beginning to gain back sensation since the surgery.  LLE Sensation: decreased light touch   Cervical / Trunk Assessment Cervical / Trunk Assessment: Kyphotic;Other exceptions Cervical / Trunk Exceptions: Increased body habitus.  s/p L3-4 laminectomy with left-sided discectomy.   Communication Communication Communication: No difficulties   Cognition Arousal/Alertness: Awake/alert Behavior During Therapy: WFL for tasks assessed/performed Overall Cognitive Status: Within Functional Limits for tasks assessed                                     General Comments       Exercises     Shoulder Instructions      Home Living Family/patient expects to be discharged to:: Private residence Living Arrangements: Alone Available Help at Discharge: Family;Friend(s);Available 24 hours/day Type of Home: House("tiny home") Home Access: Stairs to enter Entrance Stairs-Number of Steps: 1 Entrance Stairs-Rails: None Home Layout: One level     Bathroom Shower/Tub: Tub/shower unit(installing walk-in-shower shortly)         Home Equipment: Other (comment);Cane - single point(rollator)          Prior Functioning/Environment Level of Independence: Needs assistance  Gait / Transfers Assistance Needed: Pt utilizes a cane within household and rollator for ambulation in the community.  ADL's / Homemaking Assistance Needed: Family assist with dressing BLEs with wraps. Pt has been using cane to assist with LB dressing.   Comments: Pt requiring assist in taking care of ulcers on LEs and has been getting help from nephew's wife, Caryl Asp, who lives next door.         OT Problem List: Decreased range of motion;Decreased activity tolerance;Impaired balance (sitting and/or standing);Decreased safety awareness;Decreased knowledge of use of DME or AE;Decreased knowledge of  precautions;Pain;Obesity      OT Treatment/Interventions:      OT Goals(Current goals can be found in the care plan section) Acute Rehab OT Goals Patient Stated Goal: to go home OT Goal Formulation: All assessment and education complete, DC therapy  OT Frequency:     Barriers to D/C:            Co-evaluation              AM-PAC PT "6 Clicks" Daily Activity     Outcome Measure Help from another person eating meals?: None Help from another person taking care of personal grooming?: A Little Help from another person toileting, which includes using toliet, bedpan, or urinal?: A Little Help from another person bathing (including washing, rinsing, drying)?: A Little Help from another person to put on and taking off regular upper body clothing?: None Help from another person to put on and taking off regular lower body clothing?: A Little 6 Click Score: 20   End of Session Equipment Utilized During Treatment: Rolling walker Nurse Communication: Mobility status  Activity Tolerance: Patient tolerated treatment well Patient left: in chair;with call bell/phone within reach  OT Visit Diagnosis: Unsteadiness on feet (R26.81);Other abnormalities of gait and mobility (R26.89);Muscle weakness (generalized) (M62.81);Pain Pain - part of body: (Back)  Time: 1740-8144 OT Time Calculation (min): 22 min Charges:  OT General Charges $OT Visit: 1 Visit OT Evaluation $OT Eval Moderate Complexity: 1 Mod G-Codes:     Anthany Thornhill MSOT, OTR/L Acute Rehab Pager: [phone] Office: [place] 07/26/2017, 5:01 PM

## 2017-07-26 NOTE — Progress Notes (Signed)
Encouraged patient to walk this morning. Patient wants to wait until after breakfast to walk.

## 2017-07-26 NOTE — Progress Notes (Signed)
Patient is discharged from room 4NP08 at this time. Alert and in stable condition. IV site d/c'd and instructions read to patient and family with understanding verbalized. Left unit via wheelchair with all belongings at side.

## 2017-07-26 NOTE — Social Work (Signed)
CSW acknowledging consult for advanced directives, passed on consult to appropriate resource- spiritual services.   Chaplain on call has been paged.   CSW signing off. Please consult if any additional needs arise.  Doy Hutching, LCSWA [facility] Clinical Social Work [phone]

## 2017-08-02 ENCOUNTER — Other Ambulatory Visit: Payer: Self-pay

## 2017-08-02 ENCOUNTER — Ambulatory Visit: Payer: Medicare Other | Attending: Neurological Surgery

## 2017-08-02 DIAGNOSIS — M6281 Muscle weakness (generalized): Secondary | ICD-10-CM | POA: Insufficient documentation

## 2017-08-02 DIAGNOSIS — M25661 Stiffness of right knee, not elsewhere classified: Secondary | ICD-10-CM | POA: Diagnosis not present

## 2017-08-02 DIAGNOSIS — M25662 Stiffness of left knee, not elsewhere classified: Secondary | ICD-10-CM | POA: Diagnosis not present

## 2017-08-02 DIAGNOSIS — R262 Difficulty in walking, not elsewhere classified: Secondary | ICD-10-CM | POA: Diagnosis not present

## 2017-08-02 NOTE — Therapy (Addendum)
[place], [place], [address] Phone: [phone]   Fax:  [phone]  Physical Therapy Evaluation/Discharge  Patient Details  Name: Tyler Mckinney MRN: [id number] Date of Birth: 31-May-1956 Referring Provider: Marland Kitchen Ditty.    Encounter Date: 08/02/2017  PT End of Session - 08/02/17 1504    Visit Number  1    Number of Visits  16    Date for PT Re-Evaluation  10/14/17    Authorization Type  MCR    Authorization Time Period  KX visit 71    PT Start Time  0218    PT Stop Time  0255    PT Time Calculation (min)  37 min    Activity Tolerance  Patient tolerated treatment well    Behavior During Therapy  WFL for tasks assessed/performed       Past Medical History:  Diagnosis Date  . Allergy   . Aortic stenosis    mild AS by 01/12/17 echo [facility])  . Arthritis   . Atrial fibrillation ([place]) 08/21/2015  . CHF (congestive heart failure) (HCC)    chronic systolic CHF  . Chronic pain syndrome 02/04/2017  . Degenerative disc disease, lumbar 01/29/2013  . History of kidney stones 1992  . HTN (hypertension) 08/21/2015  . Hypertension   . Insomnia 08/21/2015  . Low back pain 02/04/2017  . Morbid obesity ([place]) 02/04/2017  . OSA (obstructive sleep apnea) 08/21/2015  . Pain in knee joint 11/30/2012  . Primary osteoarthritis of both knees 12/28/2012  . Pulmonary hypertension (HCC)    severe pulmonary hypertension, RVSP 84 mmHg per 01/12/17 echo [facility])  . Sleep apnea     Past Surgical History:  Procedure Laterality Date  . FRACTURE SURGERY     Left Big Toes  . HERNIA REPAIR    . LEG SURGERY Left    wound debridement  . LUMBAR LAMINECTOMY/DECOMPRESSION MICRODISCECTOMY N/A 07/25/2017   Procedure: Lumbar three-four Laminectomy with left discectomy;  Surgeon: Ditty, Kevan Ny, MD;  Location: [place];  Service: Neurosurgery;  Laterality: N/A;    There were no vitals filed for this  visit.   Subjective Assessment - 08/02/17 1423    Subjective  Post surgery laminectomy and discectomy.  Chronic painwith HNP, stennnosis withLT leg weakness.    He has been using walker  for 7 years   Back injury was 2004.  Surgery eliminateing pain for LT leg. but weak  No cramps or shooting burning pain    Pertinent History  Hernia surgery, post left calf , both big toes    Limitations  Walking    Currently in Pain?  Yes    Pain Score  8     Pain Location  Back    Pain Orientation  Lower;Left    Pain Descriptors / Indicators  Sore    Pain Type  Surgical pain    Pain Onset  1 to 4 weeks ago    Pain Frequency  Constant    Aggravating Factors   moving     Pain Relieving Factors  rest still on side.          [facility] PT Assessment - 08/02/17 0001      Assessment   Medical Diagnosis  lumbar laminectomy    Referring Provider  Marland Kitchen Ditty.     Onset Date/Surgical Date  07/25/17    Next MD Visit  08/17/17    Prior Therapy  No      Precautions  Precaution Comments  Fall risk and incision care      Restrictions   Weight Bearing Restrictions  No      Balance Screen   Has the patient fallen in the past 6 months  No      Prior Function   Level of Independence  Requires assistive device for independence    Vocation  On disability      Cognition   Overall Cognitive Status  Within Functional Limits for tasks assessed      Posture/Postural Control   Posture Comments  slump sitting       ROM / Strength   AROM / PROM / Strength  AROM;Strength      AROM   AROM Assessment Site  Knee    Right/Left Knee  Right;Left    Right Knee Extension  -35    Right Knee Flexion  100    Left Knee Extension  -35    Left Knee Flexion  90      Strength   Overall Strength Comments  DF 4+/5    Strength Assessment Site  Knee    Right/Left Knee  Right;Left    Right Knee Flexion  5/5    Right Knee Extension  5/5    Left Knee Flexion  5/5    Left Knee Extension  5/5      Palpation    Palpation comment  incision healing without seperation or weaping. Bruising into both buttock and redness 3 inches both sides lateral to lower part of incision   and one inch lateral to upper incision      Ambulation/Gait   Gait Comments  Rolator wit flexed knees and trunk             Objective measurements completed on examination: See above findings.              PT Education - 08/02/17 1503    Education provided  Yes    Education Details  POC    Person(s) Educated  Patient    Methods  Explanation    Comprehension  Verbalized understanding       PT Short Term Goals - 08/02/17 1514      PT SHORT TERM GOAL #1   Title  He will be independent with initial hEP     Time  3    Period  Weeks    Status  New      PT SHORT TERM GOAL #2   Title  He will report back pain decr 50% or more    Time  4    Period  Weeks    Status  New      PT SHORT TERM GOAL #3   Title  He will report improved mobility in home with walker    Time  4    Period  Weeks    Status  New        PT Long Term Goals - 08/02/17 1515      PT LONG TERM GOAL #1   Title  He will be independent with all HEP issued.     Time  8    Period  Weeks    Status  New      PT LONG TERM GOAL #2   Title  He will report pain in lwer back decreased to 2/10 and at times no pain.     Time  8    Period  Weeks    Status  New  PT LONG TERM GOAL #3   Title  He will be able to be independent with mobility in /out of bed and chairs  with min to  no pain    Time  8    Period  Weeks    Status  New             Plan - 08/02/17 1505    Clinical Impression Statement  [name] is only one week our from lumbar  laminectomy and discectomy. He is having back pain in lower back but surgery has resolved his left leg pain. He reports concern about doing PT so soon not wnting the incision to come apart with too much activity. He also lives in [place] so has a 40 min drive  each way . His transportation is not  reliable to consistently  get him to his appointments .  He is using a RW and has used walker s for 7 years. . He also ahs other orthopedic issues in LE limiting mobility  It may bre bette for [name] to have a course of HHPT as getting out of home to [place] is a hardship for him       History and Personal Factors relevant to plan of care:  significant OA with flexion contracture both knees. obesity, core weakness, chronic back pain and reliance on RW for mobility    Clinical Presentation  Evolving    Clinical Presentation due to:  pain and mobility limits post lumbar surgery    Clinical Decision Making  Moderate    Rehab Potential  Good    Clinical Impairments Affecting Rehab Potential  chronic back and knee pain  with bilateral knee OA and contracture, obesity    PT Frequency  2x / week    PT Duration  8 weeks    PT Treatment/Interventions  Cryotherapy;Functional mobility training;Therapeutic exercise;Patient/family education;Manual techniques;Passive range of motion    PT Next Visit Plan  LE strength and core strength still limiting back ROM,  He may appeal for HHPT due to hardship in getting to OPPT    Consulted and Agree with Plan of Care  Patient       Patient will benefit from skilled therapeutic intervention in order to improve the following deficits and impairments:  Pain, Decreased activity tolerance, Obesity, Difficulty walking, Decreased endurance, Decreased range of motion, Decreased strength, Increased muscle spasms, Postural dysfunction  Visit Diagnosis: Difficulty in walking, not elsewhere classified - Plan: PT plan of care cert/re-cert  Muscle weakness (generalized) - Plan: PT plan of care cert/re-cert  Stiffness of right knee, not elsewhere classified - Plan: PT plan of care cert/re-cert  Stiffness of left knee, not elsewhere classified - Plan: PT plan of care cert/re-cert     Problem List Patient Active Problem List   Diagnosis Date Noted  . Lumbar disc  herniation with radiculopathy 07/25/2017  . Chronic anticoagulation 06/13/2017  . Chronic systolic heart failure ([place]) 02/24/2017  . LVH (left ventricular hypertrophy) 02/23/2017  . Chronic pain syndrome 02/04/2017  . Low back pain 02/04/2017  . Morbid obesity ([place]) 02/04/2017  . OSA (obstructive sleep apnea) 08/21/2015  . Persistent atrial fibrillation ([place]) 08/21/2015  . Hypertensive heart failure ([place]) 08/21/2015  . Insomnia 08/21/2015  . Degenerative disc disease, lumbar 01/29/2013  . Primary osteoarthritis of both knees 12/28/2012  . Pain in knee joint 11/30/2012    Darrel Hoover  PT 08/02/2017, 3:26 PM  [place]  [place], [place], [address] Phone: [phone]   Fax:  [phone]  Name: Tyler Mckinney MRN: [id number] Date of Birth: May 16, 1956 PHYSICAL THERAPY DISCHARGE SUMMARY  Visits from Start of Care: 1  Current functional level related to goals / functional outcomes: She changed treatment facility to [facility]   Remaining deficits: Unknown   Education / Equipment: NA  Plan: Patient agrees to discharge.  Patient goals were not met. Patient is being discharged due to the patient's request.  ?????    Noralee Stain   PT   08/25/17

## 2017-08-12 DIAGNOSIS — L97812 Non-pressure chronic ulcer of other part of right lower leg with fat layer exposed: Secondary | ICD-10-CM | POA: Diagnosis not present

## 2017-08-12 DIAGNOSIS — I872 Venous insufficiency (chronic) (peripheral): Secondary | ICD-10-CM | POA: Diagnosis not present

## 2017-08-12 DIAGNOSIS — G4730 Sleep apnea, unspecified: Secondary | ICD-10-CM | POA: Diagnosis not present

## 2017-08-12 DIAGNOSIS — L97822 Non-pressure chronic ulcer of other part of left lower leg with fat layer exposed: Secondary | ICD-10-CM | POA: Diagnosis not present

## 2017-08-12 DIAGNOSIS — I509 Heart failure, unspecified: Secondary | ICD-10-CM | POA: Diagnosis not present

## 2017-08-12 DIAGNOSIS — I110 Hypertensive heart disease with heart failure: Secondary | ICD-10-CM | POA: Diagnosis not present

## 2017-08-12 DIAGNOSIS — I4891 Unspecified atrial fibrillation: Secondary | ICD-10-CM | POA: Diagnosis not present

## 2017-08-12 DIAGNOSIS — G894 Chronic pain syndrome: Secondary | ICD-10-CM | POA: Diagnosis not present

## 2017-08-12 DIAGNOSIS — I87313 Chronic venous hypertension (idiopathic) with ulcer of bilateral lower extremity: Secondary | ICD-10-CM | POA: Diagnosis not present

## 2017-08-12 DIAGNOSIS — M1710 Unilateral primary osteoarthritis, unspecified knee: Secondary | ICD-10-CM | POA: Diagnosis not present

## 2017-08-12 DIAGNOSIS — I890 Lymphedema, not elsewhere classified: Secondary | ICD-10-CM | POA: Diagnosis not present

## 2017-08-12 DIAGNOSIS — I739 Peripheral vascular disease, unspecified: Secondary | ICD-10-CM | POA: Diagnosis not present

## 2017-08-25 ENCOUNTER — Ambulatory Visit: Payer: Medicare Other

## 2017-08-26 DIAGNOSIS — I872 Venous insufficiency (chronic) (peripheral): Secondary | ICD-10-CM | POA: Diagnosis not present

## 2017-08-26 DIAGNOSIS — I890 Lymphedema, not elsewhere classified: Secondary | ICD-10-CM | POA: Diagnosis not present

## 2017-08-26 DIAGNOSIS — I87313 Chronic venous hypertension (idiopathic) with ulcer of bilateral lower extremity: Secondary | ICD-10-CM | POA: Diagnosis not present

## 2017-08-26 DIAGNOSIS — L97812 Non-pressure chronic ulcer of other part of right lower leg with fat layer exposed: Secondary | ICD-10-CM | POA: Diagnosis not present

## 2017-08-26 DIAGNOSIS — L97822 Non-pressure chronic ulcer of other part of left lower leg with fat layer exposed: Secondary | ICD-10-CM | POA: Diagnosis not present

## 2017-09-01 ENCOUNTER — Ambulatory Visit: Payer: Medicare Other

## 2017-09-09 DIAGNOSIS — L97822 Non-pressure chronic ulcer of other part of left lower leg with fat layer exposed: Secondary | ICD-10-CM | POA: Diagnosis not present

## 2017-09-09 DIAGNOSIS — I890 Lymphedema, not elsewhere classified: Secondary | ICD-10-CM | POA: Diagnosis not present

## 2017-09-09 DIAGNOSIS — L97812 Non-pressure chronic ulcer of other part of right lower leg with fat layer exposed: Secondary | ICD-10-CM | POA: Diagnosis not present

## 2017-09-09 DIAGNOSIS — I87313 Chronic venous hypertension (idiopathic) with ulcer of bilateral lower extremity: Secondary | ICD-10-CM | POA: Diagnosis not present

## 2017-09-09 DIAGNOSIS — I872 Venous insufficiency (chronic) (peripheral): Secondary | ICD-10-CM | POA: Diagnosis not present

## 2017-09-23 DIAGNOSIS — L97812 Non-pressure chronic ulcer of other part of right lower leg with fat layer exposed: Secondary | ICD-10-CM | POA: Diagnosis not present

## 2017-09-23 DIAGNOSIS — I87313 Chronic venous hypertension (idiopathic) with ulcer of bilateral lower extremity: Secondary | ICD-10-CM | POA: Diagnosis not present

## 2017-09-23 DIAGNOSIS — I872 Venous insufficiency (chronic) (peripheral): Secondary | ICD-10-CM | POA: Diagnosis not present

## 2017-09-23 DIAGNOSIS — I890 Lymphedema, not elsewhere classified: Secondary | ICD-10-CM | POA: Diagnosis not present

## 2017-09-23 DIAGNOSIS — L97822 Non-pressure chronic ulcer of other part of left lower leg with fat layer exposed: Secondary | ICD-10-CM | POA: Diagnosis not present

## 2017-09-30 DIAGNOSIS — I87313 Chronic venous hypertension (idiopathic) with ulcer of bilateral lower extremity: Secondary | ICD-10-CM | POA: Diagnosis not present

## 2017-09-30 DIAGNOSIS — L97812 Non-pressure chronic ulcer of other part of right lower leg with fat layer exposed: Secondary | ICD-10-CM | POA: Diagnosis not present

## 2017-09-30 DIAGNOSIS — L97822 Non-pressure chronic ulcer of other part of left lower leg with fat layer exposed: Secondary | ICD-10-CM | POA: Diagnosis not present

## 2017-09-30 DIAGNOSIS — I872 Venous insufficiency (chronic) (peripheral): Secondary | ICD-10-CM | POA: Diagnosis not present

## 2017-10-07 DIAGNOSIS — I87313 Chronic venous hypertension (idiopathic) with ulcer of bilateral lower extremity: Secondary | ICD-10-CM | POA: Diagnosis not present

## 2017-10-07 DIAGNOSIS — I890 Lymphedema, not elsewhere classified: Secondary | ICD-10-CM | POA: Diagnosis not present

## 2017-10-07 DIAGNOSIS — I872 Venous insufficiency (chronic) (peripheral): Secondary | ICD-10-CM | POA: Diagnosis not present

## 2017-10-07 DIAGNOSIS — L97812 Non-pressure chronic ulcer of other part of right lower leg with fat layer exposed: Secondary | ICD-10-CM | POA: Diagnosis not present

## 2017-10-07 DIAGNOSIS — L97822 Non-pressure chronic ulcer of other part of left lower leg with fat layer exposed: Secondary | ICD-10-CM | POA: Diagnosis not present

## 2017-10-14 DIAGNOSIS — L97812 Non-pressure chronic ulcer of other part of right lower leg with fat layer exposed: Secondary | ICD-10-CM | POA: Diagnosis not present

## 2017-10-14 DIAGNOSIS — L97822 Non-pressure chronic ulcer of other part of left lower leg with fat layer exposed: Secondary | ICD-10-CM | POA: Diagnosis not present

## 2017-10-14 DIAGNOSIS — I872 Venous insufficiency (chronic) (peripheral): Secondary | ICD-10-CM | POA: Diagnosis not present

## 2017-10-14 DIAGNOSIS — I87313 Chronic venous hypertension (idiopathic) with ulcer of bilateral lower extremity: Secondary | ICD-10-CM | POA: Diagnosis not present

## 2017-10-21 DIAGNOSIS — I890 Lymphedema, not elsewhere classified: Secondary | ICD-10-CM | POA: Diagnosis not present

## 2017-10-21 DIAGNOSIS — L97822 Non-pressure chronic ulcer of other part of left lower leg with fat layer exposed: Secondary | ICD-10-CM | POA: Diagnosis not present

## 2017-10-21 DIAGNOSIS — L97812 Non-pressure chronic ulcer of other part of right lower leg with fat layer exposed: Secondary | ICD-10-CM | POA: Diagnosis not present

## 2017-10-21 DIAGNOSIS — I87313 Chronic venous hypertension (idiopathic) with ulcer of bilateral lower extremity: Secondary | ICD-10-CM | POA: Diagnosis not present

## 2017-10-24 ENCOUNTER — Telehealth: Payer: Self-pay | Admitting: Cardiology

## 2017-10-24 DIAGNOSIS — Z7901 Long term (current) use of anticoagulants: Secondary | ICD-10-CM

## 2017-10-24 NOTE — Telephone Encounter (Signed)
Left message to return call 

## 2017-10-24 NOTE — Telephone Encounter (Signed)
Need to reduce eloquis, bleeding too easily per wound doctor
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
## 2017-10-24 NOTE — Telephone Encounter (Signed)
Patient advised to stop Eliquis for 2 weeks. Patient will go to his PCP's office tomorrow for CBC. Advised patient will call when results received. Patient verbalized understanding, no further questions.

## 2017-10-24 NOTE — Telephone Encounter (Signed)
Lets stop for 2 weeks and check a CBC

## 2017-10-25 DIAGNOSIS — E349 Endocrine disorder, unspecified: Secondary | ICD-10-CM | POA: Diagnosis not present

## 2017-10-25 DIAGNOSIS — Z79899 Other long term (current) drug therapy: Secondary | ICD-10-CM | POA: Diagnosis not present

## 2017-10-25 DIAGNOSIS — Z125 Encounter for screening for malignant neoplasm of prostate: Secondary | ICD-10-CM | POA: Diagnosis not present

## 2017-10-28 DIAGNOSIS — L97812 Non-pressure chronic ulcer of other part of right lower leg with fat layer exposed: Secondary | ICD-10-CM | POA: Diagnosis not present

## 2017-10-28 DIAGNOSIS — I87313 Chronic venous hypertension (idiopathic) with ulcer of bilateral lower extremity: Secondary | ICD-10-CM | POA: Diagnosis not present

## 2017-10-28 DIAGNOSIS — L97822 Non-pressure chronic ulcer of other part of left lower leg with fat layer exposed: Secondary | ICD-10-CM | POA: Diagnosis not present

## 2017-11-10 NOTE — Telephone Encounter (Signed)
Patient advised to restart Eliquis per Dr. Dulce Sellar after reviewing lab work from primary care physicians office. Patient verbalized understanding. No further questions.

## 2017-11-11 DIAGNOSIS — Z6841 Body Mass Index (BMI) 40.0 and over, adult: Secondary | ICD-10-CM | POA: Diagnosis not present

## 2017-11-11 DIAGNOSIS — L97812 Non-pressure chronic ulcer of other part of right lower leg with fat layer exposed: Secondary | ICD-10-CM | POA: Diagnosis not present

## 2017-11-11 DIAGNOSIS — E669 Obesity, unspecified: Secondary | ICD-10-CM | POA: Diagnosis not present

## 2017-11-11 DIAGNOSIS — I83009 Varicose veins of unspecified lower extremity with ulcer of unspecified site: Secondary | ICD-10-CM | POA: Diagnosis not present

## 2017-11-11 DIAGNOSIS — I87313 Chronic venous hypertension (idiopathic) with ulcer of bilateral lower extremity: Secondary | ICD-10-CM | POA: Diagnosis not present

## 2017-11-11 DIAGNOSIS — M549 Dorsalgia, unspecified: Secondary | ICD-10-CM | POA: Diagnosis not present

## 2017-11-11 DIAGNOSIS — I890 Lymphedema, not elsewhere classified: Secondary | ICD-10-CM | POA: Diagnosis not present

## 2017-11-11 DIAGNOSIS — Z139 Encounter for screening, unspecified: Secondary | ICD-10-CM | POA: Diagnosis not present

## 2017-11-11 DIAGNOSIS — E785 Hyperlipidemia, unspecified: Secondary | ICD-10-CM | POA: Diagnosis not present

## 2017-11-11 DIAGNOSIS — Z0000 Encounter for general adult medical examination without abnormal findings: Secondary | ICD-10-CM | POA: Diagnosis not present

## 2017-11-11 DIAGNOSIS — I87311 Chronic venous hypertension (idiopathic) with ulcer of right lower extremity: Secondary | ICD-10-CM | POA: Diagnosis not present

## 2017-11-11 DIAGNOSIS — Z136 Encounter for screening for cardiovascular disorders: Secondary | ICD-10-CM | POA: Diagnosis not present

## 2017-11-11 DIAGNOSIS — Z125 Encounter for screening for malignant neoplasm of prostate: Secondary | ICD-10-CM | POA: Diagnosis not present

## 2017-11-25 DIAGNOSIS — L97812 Non-pressure chronic ulcer of other part of right lower leg with fat layer exposed: Secondary | ICD-10-CM | POA: Diagnosis not present

## 2017-11-25 DIAGNOSIS — I87313 Chronic venous hypertension (idiopathic) with ulcer of bilateral lower extremity: Secondary | ICD-10-CM | POA: Diagnosis not present

## 2017-11-25 DIAGNOSIS — L97822 Non-pressure chronic ulcer of other part of left lower leg with fat layer exposed: Secondary | ICD-10-CM | POA: Diagnosis not present

## 2017-12-09 DIAGNOSIS — L97812 Non-pressure chronic ulcer of other part of right lower leg with fat layer exposed: Secondary | ICD-10-CM | POA: Diagnosis not present

## 2017-12-09 DIAGNOSIS — I87313 Chronic venous hypertension (idiopathic) with ulcer of bilateral lower extremity: Secondary | ICD-10-CM | POA: Diagnosis not present

## 2017-12-09 DIAGNOSIS — L4050 Arthropathic psoriasis, unspecified: Secondary | ICD-10-CM | POA: Diagnosis not present

## 2017-12-09 DIAGNOSIS — J019 Acute sinusitis, unspecified: Secondary | ICD-10-CM | POA: Diagnosis not present

## 2017-12-09 DIAGNOSIS — I890 Lymphedema, not elsewhere classified: Secondary | ICD-10-CM | POA: Diagnosis not present

## 2017-12-09 DIAGNOSIS — Z6841 Body Mass Index (BMI) 40.0 and over, adult: Secondary | ICD-10-CM | POA: Diagnosis not present

## 2017-12-09 DIAGNOSIS — L97822 Non-pressure chronic ulcer of other part of left lower leg with fat layer exposed: Secondary | ICD-10-CM | POA: Diagnosis not present

## 2017-12-23 DIAGNOSIS — I87313 Chronic venous hypertension (idiopathic) with ulcer of bilateral lower extremity: Secondary | ICD-10-CM | POA: Diagnosis not present

## 2017-12-23 DIAGNOSIS — L97812 Non-pressure chronic ulcer of other part of right lower leg with fat layer exposed: Secondary | ICD-10-CM | POA: Diagnosis not present

## 2017-12-23 DIAGNOSIS — L97822 Non-pressure chronic ulcer of other part of left lower leg with fat layer exposed: Secondary | ICD-10-CM | POA: Diagnosis not present

## 2017-12-23 DIAGNOSIS — I890 Lymphedema, not elsewhere classified: Secondary | ICD-10-CM | POA: Diagnosis not present

## 2018-01-06 DIAGNOSIS — L97812 Non-pressure chronic ulcer of other part of right lower leg with fat layer exposed: Secondary | ICD-10-CM | POA: Diagnosis not present

## 2018-01-06 DIAGNOSIS — I87313 Chronic venous hypertension (idiopathic) with ulcer of bilateral lower extremity: Secondary | ICD-10-CM | POA: Diagnosis not present

## 2018-01-06 DIAGNOSIS — L97822 Non-pressure chronic ulcer of other part of left lower leg with fat layer exposed: Secondary | ICD-10-CM | POA: Diagnosis not present

## 2018-01-26 DIAGNOSIS — L97822 Non-pressure chronic ulcer of other part of left lower leg with fat layer exposed: Secondary | ICD-10-CM | POA: Diagnosis not present

## 2018-01-26 DIAGNOSIS — L97812 Non-pressure chronic ulcer of other part of right lower leg with fat layer exposed: Secondary | ICD-10-CM | POA: Diagnosis not present

## 2018-01-26 DIAGNOSIS — I87313 Chronic venous hypertension (idiopathic) with ulcer of bilateral lower extremity: Secondary | ICD-10-CM | POA: Diagnosis not present

## 2018-02-03 DIAGNOSIS — L97822 Non-pressure chronic ulcer of other part of left lower leg with fat layer exposed: Secondary | ICD-10-CM | POA: Diagnosis not present

## 2018-02-03 DIAGNOSIS — L97812 Non-pressure chronic ulcer of other part of right lower leg with fat layer exposed: Secondary | ICD-10-CM | POA: Diagnosis not present

## 2018-02-03 DIAGNOSIS — I87313 Chronic venous hypertension (idiopathic) with ulcer of bilateral lower extremity: Secondary | ICD-10-CM | POA: Diagnosis not present

## 2018-02-17 DIAGNOSIS — L97812 Non-pressure chronic ulcer of other part of right lower leg with fat layer exposed: Secondary | ICD-10-CM | POA: Diagnosis not present

## 2018-02-17 DIAGNOSIS — L97822 Non-pressure chronic ulcer of other part of left lower leg with fat layer exposed: Secondary | ICD-10-CM | POA: Diagnosis not present

## 2018-02-17 DIAGNOSIS — I890 Lymphedema, not elsewhere classified: Secondary | ICD-10-CM | POA: Diagnosis not present

## 2018-02-17 DIAGNOSIS — I87313 Chronic venous hypertension (idiopathic) with ulcer of bilateral lower extremity: Secondary | ICD-10-CM | POA: Diagnosis not present

## 2018-03-03 DIAGNOSIS — L97812 Non-pressure chronic ulcer of other part of right lower leg with fat layer exposed: Secondary | ICD-10-CM | POA: Diagnosis not present

## 2018-03-03 DIAGNOSIS — L97822 Non-pressure chronic ulcer of other part of left lower leg with fat layer exposed: Secondary | ICD-10-CM | POA: Diagnosis not present

## 2018-03-03 DIAGNOSIS — I890 Lymphedema, not elsewhere classified: Secondary | ICD-10-CM | POA: Diagnosis not present

## 2018-03-03 DIAGNOSIS — I87313 Chronic venous hypertension (idiopathic) with ulcer of bilateral lower extremity: Secondary | ICD-10-CM | POA: Diagnosis not present

## 2018-03-13 DIAGNOSIS — L4050 Arthropathic psoriasis, unspecified: Secondary | ICD-10-CM | POA: Diagnosis not present

## 2018-03-13 DIAGNOSIS — I83009 Varicose veins of unspecified lower extremity with ulcer of unspecified site: Secondary | ICD-10-CM | POA: Diagnosis not present

## 2018-03-13 DIAGNOSIS — R5383 Other fatigue: Secondary | ICD-10-CM | POA: Diagnosis not present

## 2018-03-13 DIAGNOSIS — M549 Dorsalgia, unspecified: Secondary | ICD-10-CM | POA: Diagnosis not present

## 2018-03-13 DIAGNOSIS — E349 Endocrine disorder, unspecified: Secondary | ICD-10-CM | POA: Diagnosis not present

## 2018-03-13 DIAGNOSIS — Z1331 Encounter for screening for depression: Secondary | ICD-10-CM | POA: Diagnosis not present

## 2018-03-13 DIAGNOSIS — I10 Essential (primary) hypertension: Secondary | ICD-10-CM | POA: Diagnosis not present

## 2018-03-15 IMAGING — CR DG LUMBAR SPINE 2-3V
3 series · 3 of 3 positions shown · non-contrast
Comparison: None.

CLINICAL DATA: Intraoperative localization

EXAM:
LUMBAR SPINE - 2-3 VIEW

[lateral (1 of 3)]
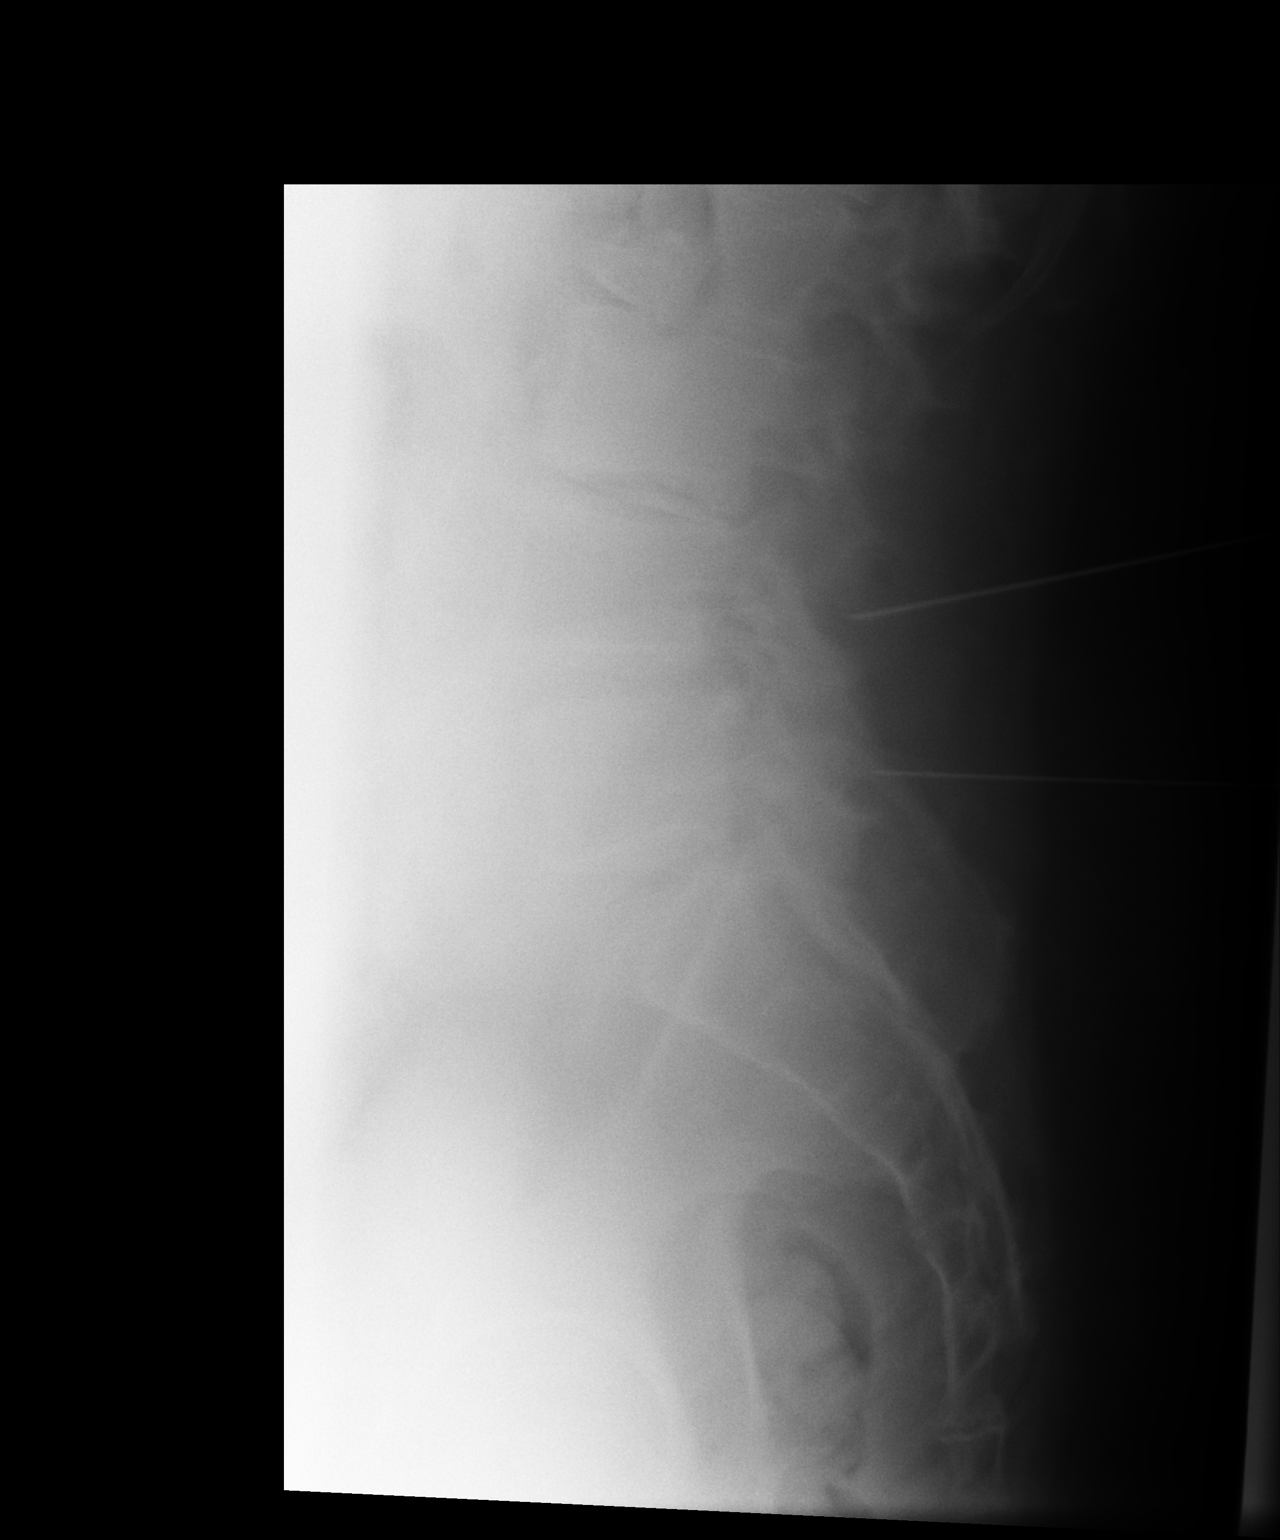

[lateral (2 of 3)]
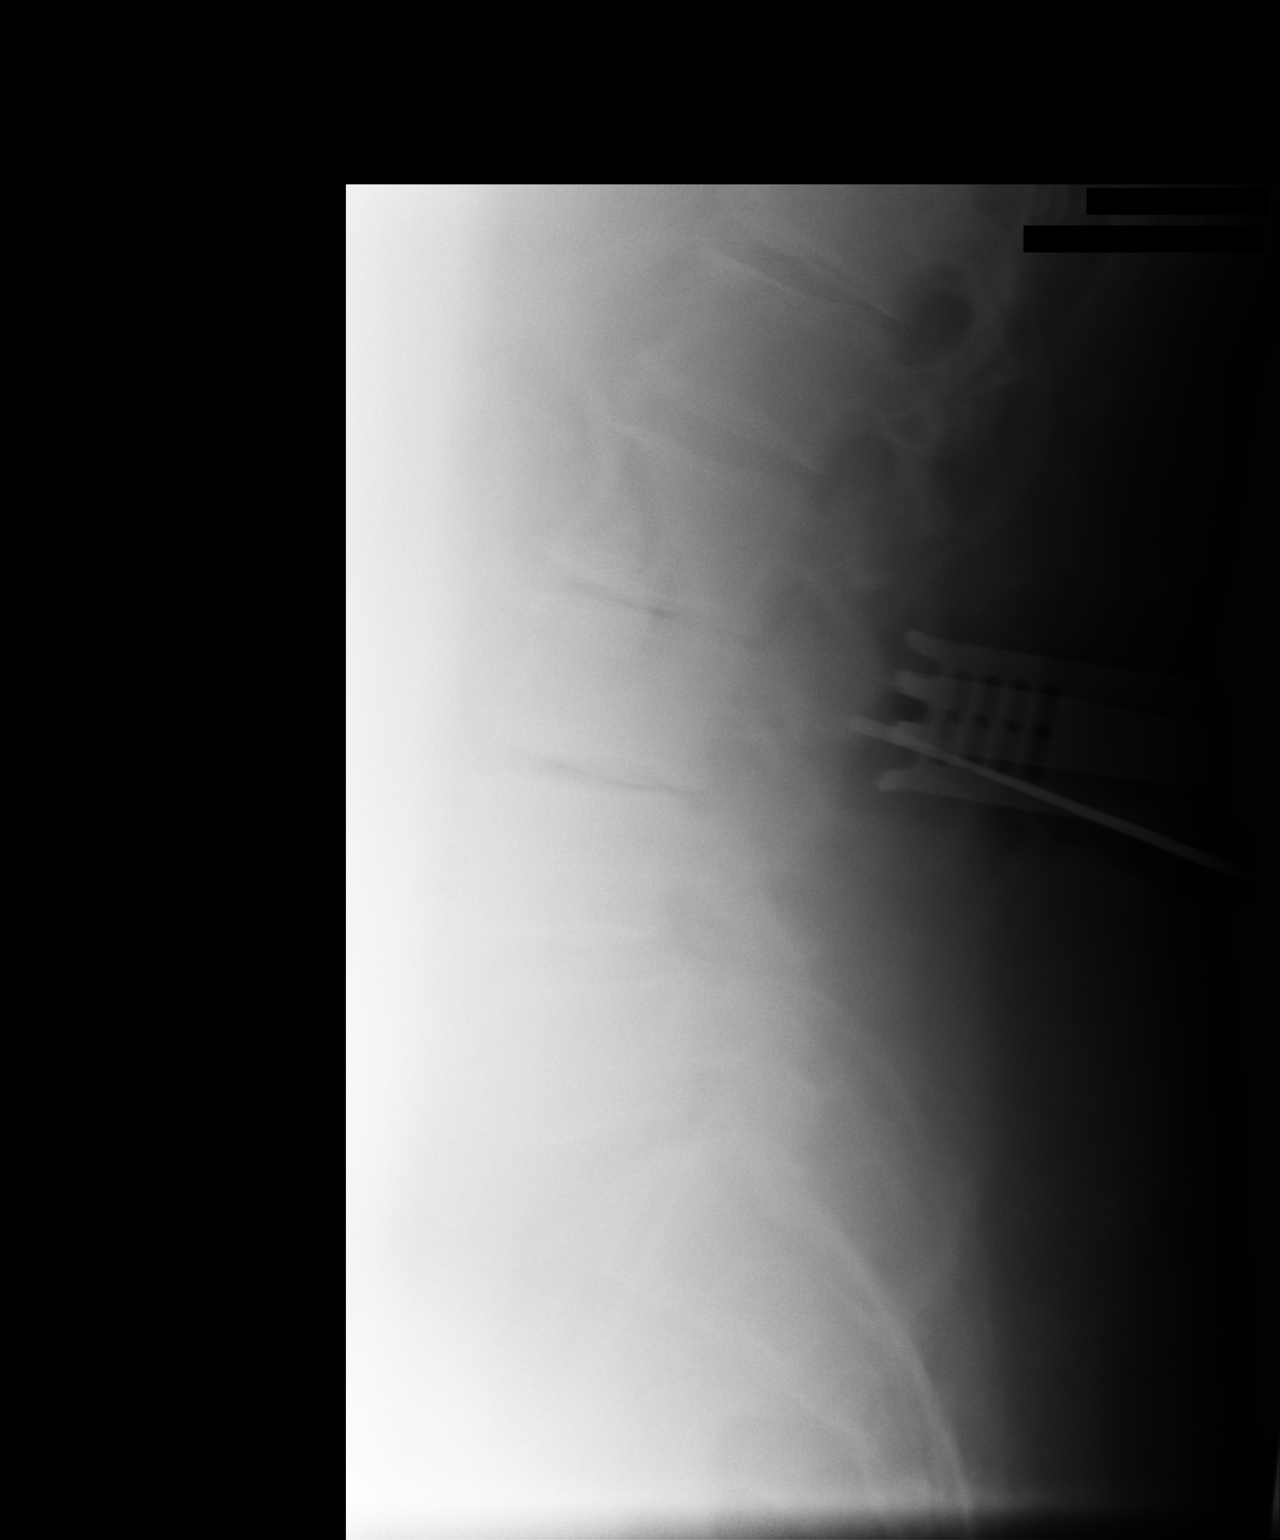

[lateral (3 of 3)]
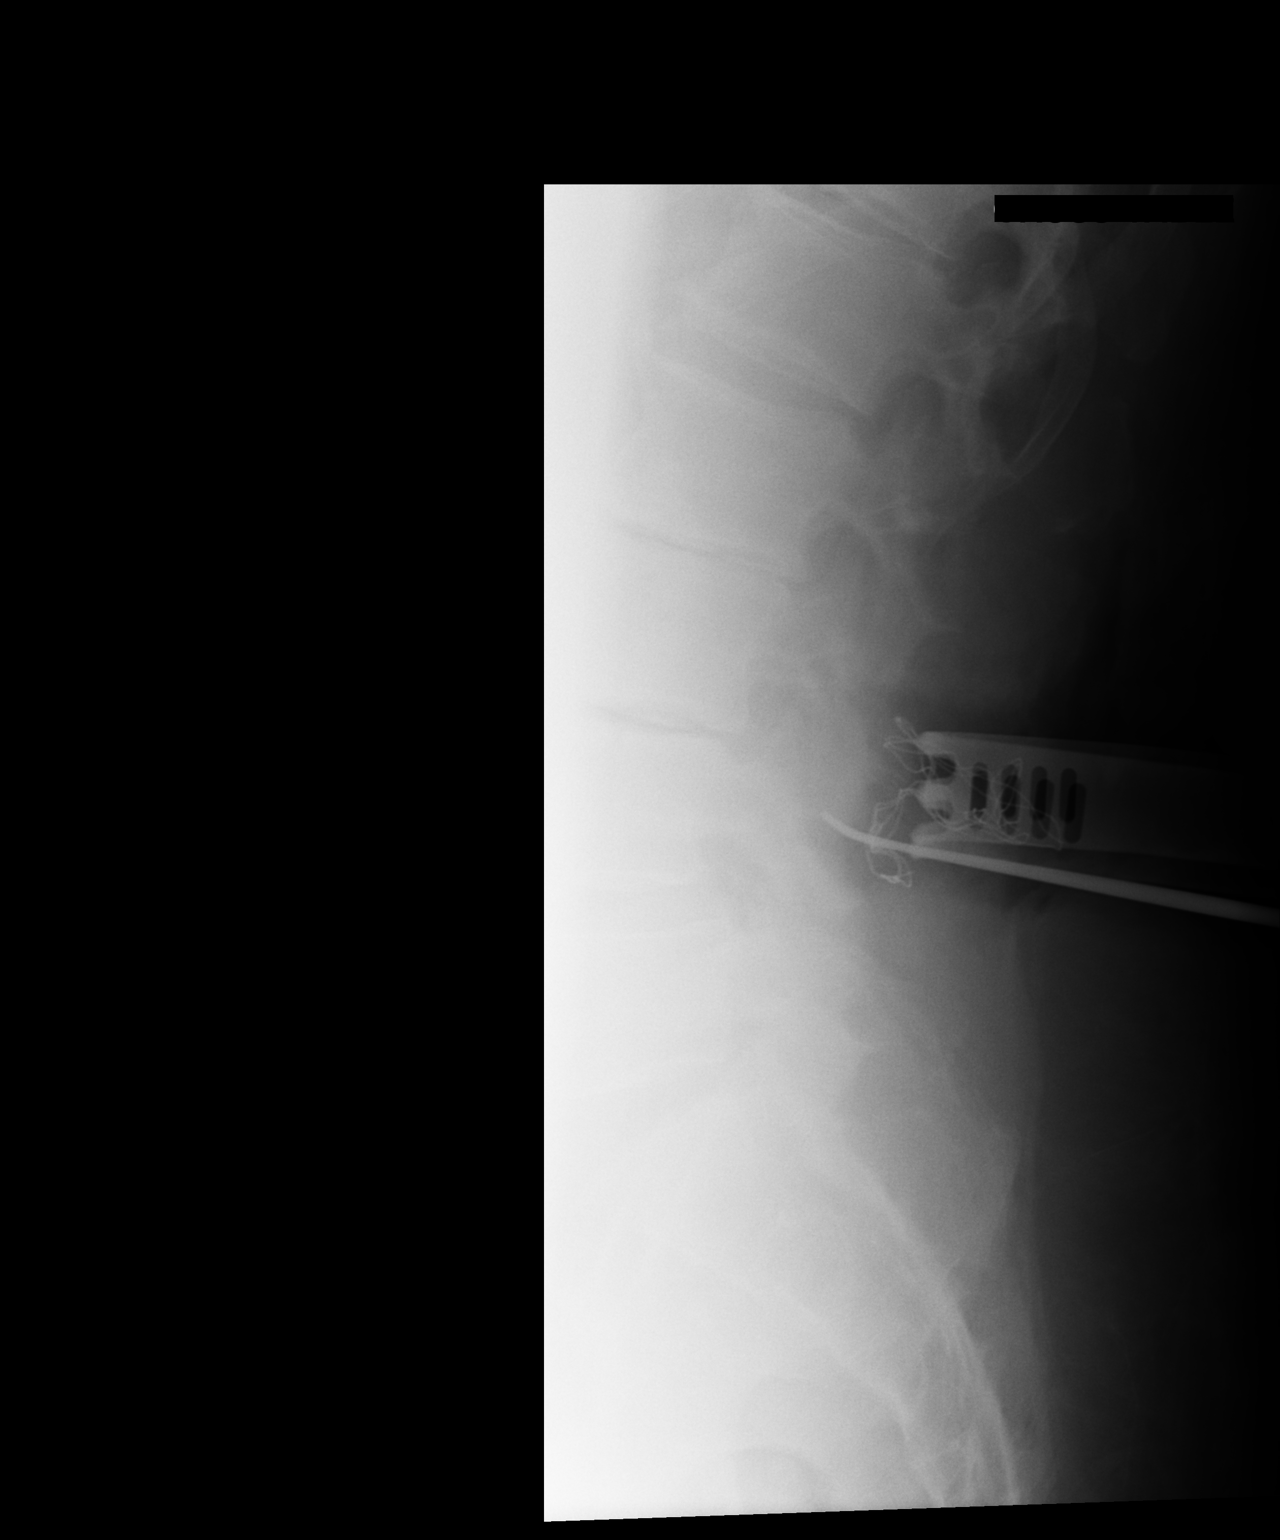

[3 of 3 positions shown; findings below may reference images not displayed]

FINDINGS: First lateral intraoperative image demonstrates posterior needles
directed at the L4 and L5 vertebral bodies.

Second lateral intraoperative image demonstrates posterior surgical
instrument directed at the L3 pedicle.

Third lateral intraoperative image demonstrates posterior surgical
instruments directed at L3-4.
IMPRESSION: As above.

## 2018-03-17 DIAGNOSIS — L97822 Non-pressure chronic ulcer of other part of left lower leg with fat layer exposed: Secondary | ICD-10-CM | POA: Diagnosis not present

## 2018-03-17 DIAGNOSIS — I87313 Chronic venous hypertension (idiopathic) with ulcer of bilateral lower extremity: Secondary | ICD-10-CM | POA: Diagnosis not present

## 2018-03-17 DIAGNOSIS — I890 Lymphedema, not elsewhere classified: Secondary | ICD-10-CM | POA: Diagnosis not present

## 2018-03-17 DIAGNOSIS — L97812 Non-pressure chronic ulcer of other part of right lower leg with fat layer exposed: Secondary | ICD-10-CM | POA: Diagnosis not present

## 2018-03-19 DIAGNOSIS — G894 Chronic pain syndrome: Secondary | ICD-10-CM | POA: Diagnosis not present

## 2018-03-19 DIAGNOSIS — I4891 Unspecified atrial fibrillation: Secondary | ICD-10-CM | POA: Diagnosis not present

## 2018-03-19 DIAGNOSIS — I87313 Chronic venous hypertension (idiopathic) with ulcer of bilateral lower extremity: Secondary | ICD-10-CM | POA: Diagnosis not present

## 2018-03-19 DIAGNOSIS — L97812 Non-pressure chronic ulcer of other part of right lower leg with fat layer exposed: Secondary | ICD-10-CM | POA: Diagnosis not present

## 2018-03-19 DIAGNOSIS — I890 Lymphedema, not elsewhere classified: Secondary | ICD-10-CM | POA: Diagnosis not present

## 2018-03-19 DIAGNOSIS — L97822 Non-pressure chronic ulcer of other part of left lower leg with fat layer exposed: Secondary | ICD-10-CM | POA: Diagnosis not present

## 2018-03-22 DIAGNOSIS — L97822 Non-pressure chronic ulcer of other part of left lower leg with fat layer exposed: Secondary | ICD-10-CM | POA: Diagnosis not present

## 2018-03-22 DIAGNOSIS — I890 Lymphedema, not elsewhere classified: Secondary | ICD-10-CM | POA: Diagnosis not present

## 2018-03-22 DIAGNOSIS — I4891 Unspecified atrial fibrillation: Secondary | ICD-10-CM | POA: Diagnosis not present

## 2018-03-22 DIAGNOSIS — G894 Chronic pain syndrome: Secondary | ICD-10-CM | POA: Diagnosis not present

## 2018-03-22 DIAGNOSIS — I87313 Chronic venous hypertension (idiopathic) with ulcer of bilateral lower extremity: Secondary | ICD-10-CM | POA: Diagnosis not present

## 2018-03-22 DIAGNOSIS — L97812 Non-pressure chronic ulcer of other part of right lower leg with fat layer exposed: Secondary | ICD-10-CM | POA: Diagnosis not present

## 2018-03-24 DIAGNOSIS — L97812 Non-pressure chronic ulcer of other part of right lower leg with fat layer exposed: Secondary | ICD-10-CM | POA: Diagnosis not present

## 2018-03-24 DIAGNOSIS — I87313 Chronic venous hypertension (idiopathic) with ulcer of bilateral lower extremity: Secondary | ICD-10-CM | POA: Diagnosis not present

## 2018-03-24 DIAGNOSIS — I890 Lymphedema, not elsewhere classified: Secondary | ICD-10-CM | POA: Diagnosis not present

## 2018-03-24 DIAGNOSIS — I4891 Unspecified atrial fibrillation: Secondary | ICD-10-CM | POA: Diagnosis not present

## 2018-03-24 DIAGNOSIS — G894 Chronic pain syndrome: Secondary | ICD-10-CM | POA: Diagnosis not present

## 2018-03-24 DIAGNOSIS — L97822 Non-pressure chronic ulcer of other part of left lower leg with fat layer exposed: Secondary | ICD-10-CM | POA: Diagnosis not present

## 2018-03-27 DIAGNOSIS — N289 Disorder of kidney and ureter, unspecified: Secondary | ICD-10-CM | POA: Diagnosis not present

## 2018-03-27 DIAGNOSIS — L97822 Non-pressure chronic ulcer of other part of left lower leg with fat layer exposed: Secondary | ICD-10-CM | POA: Diagnosis not present

## 2018-03-27 DIAGNOSIS — L97812 Non-pressure chronic ulcer of other part of right lower leg with fat layer exposed: Secondary | ICD-10-CM | POA: Diagnosis not present

## 2018-03-27 DIAGNOSIS — G894 Chronic pain syndrome: Secondary | ICD-10-CM | POA: Diagnosis not present

## 2018-03-27 DIAGNOSIS — I87313 Chronic venous hypertension (idiopathic) with ulcer of bilateral lower extremity: Secondary | ICD-10-CM | POA: Diagnosis not present

## 2018-03-27 DIAGNOSIS — I890 Lymphedema, not elsewhere classified: Secondary | ICD-10-CM | POA: Diagnosis not present

## 2018-03-27 DIAGNOSIS — I4891 Unspecified atrial fibrillation: Secondary | ICD-10-CM | POA: Diagnosis not present

## 2018-03-27 DIAGNOSIS — D649 Anemia, unspecified: Secondary | ICD-10-CM | POA: Diagnosis not present

## 2018-03-29 DIAGNOSIS — G894 Chronic pain syndrome: Secondary | ICD-10-CM | POA: Diagnosis not present

## 2018-03-29 DIAGNOSIS — I87313 Chronic venous hypertension (idiopathic) with ulcer of bilateral lower extremity: Secondary | ICD-10-CM | POA: Diagnosis not present

## 2018-03-29 DIAGNOSIS — I890 Lymphedema, not elsewhere classified: Secondary | ICD-10-CM | POA: Diagnosis not present

## 2018-03-29 DIAGNOSIS — L97822 Non-pressure chronic ulcer of other part of left lower leg with fat layer exposed: Secondary | ICD-10-CM | POA: Diagnosis not present

## 2018-03-29 DIAGNOSIS — I4891 Unspecified atrial fibrillation: Secondary | ICD-10-CM | POA: Diagnosis not present

## 2018-03-29 DIAGNOSIS — L97812 Non-pressure chronic ulcer of other part of right lower leg with fat layer exposed: Secondary | ICD-10-CM | POA: Diagnosis not present

## 2018-03-31 DIAGNOSIS — L97822 Non-pressure chronic ulcer of other part of left lower leg with fat layer exposed: Secondary | ICD-10-CM | POA: Diagnosis not present

## 2018-03-31 DIAGNOSIS — I890 Lymphedema, not elsewhere classified: Secondary | ICD-10-CM | POA: Diagnosis not present

## 2018-03-31 DIAGNOSIS — L97812 Non-pressure chronic ulcer of other part of right lower leg with fat layer exposed: Secondary | ICD-10-CM | POA: Diagnosis not present

## 2018-03-31 DIAGNOSIS — I87333 Chronic venous hypertension (idiopathic) with ulcer and inflammation of bilateral lower extremity: Secondary | ICD-10-CM | POA: Diagnosis not present

## 2018-04-03 DIAGNOSIS — L97822 Non-pressure chronic ulcer of other part of left lower leg with fat layer exposed: Secondary | ICD-10-CM | POA: Diagnosis not present

## 2018-04-03 DIAGNOSIS — I4891 Unspecified atrial fibrillation: Secondary | ICD-10-CM | POA: Diagnosis not present

## 2018-04-03 DIAGNOSIS — L97812 Non-pressure chronic ulcer of other part of right lower leg with fat layer exposed: Secondary | ICD-10-CM | POA: Diagnosis not present

## 2018-04-03 DIAGNOSIS — I87313 Chronic venous hypertension (idiopathic) with ulcer of bilateral lower extremity: Secondary | ICD-10-CM | POA: Diagnosis not present

## 2018-04-03 DIAGNOSIS — G894 Chronic pain syndrome: Secondary | ICD-10-CM | POA: Diagnosis not present

## 2018-04-03 DIAGNOSIS — I890 Lymphedema, not elsewhere classified: Secondary | ICD-10-CM | POA: Diagnosis not present

## 2018-04-04 DIAGNOSIS — M549 Dorsalgia, unspecified: Secondary | ICD-10-CM | POA: Diagnosis not present

## 2018-04-04 DIAGNOSIS — R17 Unspecified jaundice: Secondary | ICD-10-CM | POA: Diagnosis not present

## 2018-04-04 DIAGNOSIS — R531 Weakness: Secondary | ICD-10-CM | POA: Diagnosis not present

## 2018-04-04 DIAGNOSIS — G629 Polyneuropathy, unspecified: Secondary | ICD-10-CM | POA: Diagnosis not present

## 2018-04-04 DIAGNOSIS — R509 Fever, unspecified: Secondary | ICD-10-CM | POA: Diagnosis not present

## 2018-04-05 DIAGNOSIS — I890 Lymphedema, not elsewhere classified: Secondary | ICD-10-CM | POA: Diagnosis not present

## 2018-04-05 DIAGNOSIS — L97812 Non-pressure chronic ulcer of other part of right lower leg with fat layer exposed: Secondary | ICD-10-CM | POA: Diagnosis not present

## 2018-04-05 DIAGNOSIS — I4891 Unspecified atrial fibrillation: Secondary | ICD-10-CM | POA: Diagnosis not present

## 2018-04-05 DIAGNOSIS — L97822 Non-pressure chronic ulcer of other part of left lower leg with fat layer exposed: Secondary | ICD-10-CM | POA: Diagnosis not present

## 2018-04-05 DIAGNOSIS — G894 Chronic pain syndrome: Secondary | ICD-10-CM | POA: Diagnosis not present

## 2018-04-05 DIAGNOSIS — I87313 Chronic venous hypertension (idiopathic) with ulcer of bilateral lower extremity: Secondary | ICD-10-CM | POA: Diagnosis not present

## 2018-04-07 DIAGNOSIS — L97822 Non-pressure chronic ulcer of other part of left lower leg with fat layer exposed: Secondary | ICD-10-CM | POA: Diagnosis not present

## 2018-04-07 DIAGNOSIS — I890 Lymphedema, not elsewhere classified: Secondary | ICD-10-CM | POA: Diagnosis not present

## 2018-04-07 DIAGNOSIS — I87313 Chronic venous hypertension (idiopathic) with ulcer of bilateral lower extremity: Secondary | ICD-10-CM | POA: Diagnosis not present

## 2018-04-07 DIAGNOSIS — L97812 Non-pressure chronic ulcer of other part of right lower leg with fat layer exposed: Secondary | ICD-10-CM | POA: Diagnosis not present

## 2018-04-10 DIAGNOSIS — G894 Chronic pain syndrome: Secondary | ICD-10-CM | POA: Diagnosis not present

## 2018-04-10 DIAGNOSIS — L97822 Non-pressure chronic ulcer of other part of left lower leg with fat layer exposed: Secondary | ICD-10-CM | POA: Diagnosis not present

## 2018-04-10 DIAGNOSIS — I4891 Unspecified atrial fibrillation: Secondary | ICD-10-CM | POA: Diagnosis not present

## 2018-04-10 DIAGNOSIS — L97812 Non-pressure chronic ulcer of other part of right lower leg with fat layer exposed: Secondary | ICD-10-CM | POA: Diagnosis not present

## 2018-04-10 DIAGNOSIS — I890 Lymphedema, not elsewhere classified: Secondary | ICD-10-CM | POA: Diagnosis not present

## 2018-04-10 DIAGNOSIS — I87313 Chronic venous hypertension (idiopathic) with ulcer of bilateral lower extremity: Secondary | ICD-10-CM | POA: Diagnosis not present

## 2018-04-12 DIAGNOSIS — G894 Chronic pain syndrome: Secondary | ICD-10-CM | POA: Diagnosis not present

## 2018-04-12 DIAGNOSIS — L97812 Non-pressure chronic ulcer of other part of right lower leg with fat layer exposed: Secondary | ICD-10-CM | POA: Diagnosis not present

## 2018-04-12 DIAGNOSIS — I4891 Unspecified atrial fibrillation: Secondary | ICD-10-CM | POA: Diagnosis not present

## 2018-04-12 DIAGNOSIS — I87313 Chronic venous hypertension (idiopathic) with ulcer of bilateral lower extremity: Secondary | ICD-10-CM | POA: Diagnosis not present

## 2018-04-12 DIAGNOSIS — L97822 Non-pressure chronic ulcer of other part of left lower leg with fat layer exposed: Secondary | ICD-10-CM | POA: Diagnosis not present

## 2018-04-12 DIAGNOSIS — I890 Lymphedema, not elsewhere classified: Secondary | ICD-10-CM | POA: Diagnosis not present

## 2018-04-14 DIAGNOSIS — G894 Chronic pain syndrome: Secondary | ICD-10-CM | POA: Diagnosis not present

## 2018-04-14 DIAGNOSIS — I890 Lymphedema, not elsewhere classified: Secondary | ICD-10-CM | POA: Diagnosis not present

## 2018-04-14 DIAGNOSIS — I87313 Chronic venous hypertension (idiopathic) with ulcer of bilateral lower extremity: Secondary | ICD-10-CM | POA: Diagnosis not present

## 2018-04-14 DIAGNOSIS — L97812 Non-pressure chronic ulcer of other part of right lower leg with fat layer exposed: Secondary | ICD-10-CM | POA: Diagnosis not present

## 2018-04-14 DIAGNOSIS — L97822 Non-pressure chronic ulcer of other part of left lower leg with fat layer exposed: Secondary | ICD-10-CM | POA: Diagnosis not present

## 2018-04-14 DIAGNOSIS — I4891 Unspecified atrial fibrillation: Secondary | ICD-10-CM | POA: Diagnosis not present

## 2018-04-17 DIAGNOSIS — L97822 Non-pressure chronic ulcer of other part of left lower leg with fat layer exposed: Secondary | ICD-10-CM | POA: Diagnosis not present

## 2018-04-17 DIAGNOSIS — G894 Chronic pain syndrome: Secondary | ICD-10-CM | POA: Diagnosis not present

## 2018-04-17 DIAGNOSIS — I4891 Unspecified atrial fibrillation: Secondary | ICD-10-CM | POA: Diagnosis not present

## 2018-04-17 DIAGNOSIS — L97812 Non-pressure chronic ulcer of other part of right lower leg with fat layer exposed: Secondary | ICD-10-CM | POA: Diagnosis not present

## 2018-04-17 DIAGNOSIS — I890 Lymphedema, not elsewhere classified: Secondary | ICD-10-CM | POA: Diagnosis not present

## 2018-04-17 DIAGNOSIS — I87313 Chronic venous hypertension (idiopathic) with ulcer of bilateral lower extremity: Secondary | ICD-10-CM | POA: Diagnosis not present

## 2018-04-18 ENCOUNTER — Telehealth: Payer: Self-pay

## 2018-04-18 MED ORDER — CARVEDILOL 25 MG PO TABS: 25.0000 mg | ORAL_TABLET | Freq: Two times a day (BID) | ORAL | 0 refills | Status: DC

## 2018-04-18 NOTE — Telephone Encounter (Signed)
Rx sent to pharmacy as requested.

## 2018-04-19 DIAGNOSIS — I87313 Chronic venous hypertension (idiopathic) with ulcer of bilateral lower extremity: Secondary | ICD-10-CM | POA: Diagnosis not present

## 2018-04-19 DIAGNOSIS — I890 Lymphedema, not elsewhere classified: Secondary | ICD-10-CM | POA: Diagnosis not present

## 2018-04-19 DIAGNOSIS — I4891 Unspecified atrial fibrillation: Secondary | ICD-10-CM | POA: Diagnosis not present

## 2018-04-19 DIAGNOSIS — L97822 Non-pressure chronic ulcer of other part of left lower leg with fat layer exposed: Secondary | ICD-10-CM | POA: Diagnosis not present

## 2018-04-19 DIAGNOSIS — L97812 Non-pressure chronic ulcer of other part of right lower leg with fat layer exposed: Secondary | ICD-10-CM | POA: Diagnosis not present

## 2018-04-19 DIAGNOSIS — G894 Chronic pain syndrome: Secondary | ICD-10-CM | POA: Diagnosis not present

## 2018-04-20 DIAGNOSIS — L97812 Non-pressure chronic ulcer of other part of right lower leg with fat layer exposed: Secondary | ICD-10-CM | POA: Diagnosis not present

## 2018-04-20 DIAGNOSIS — L97822 Non-pressure chronic ulcer of other part of left lower leg with fat layer exposed: Secondary | ICD-10-CM | POA: Diagnosis not present

## 2018-04-20 DIAGNOSIS — G894 Chronic pain syndrome: Secondary | ICD-10-CM | POA: Diagnosis not present

## 2018-04-20 DIAGNOSIS — I4891 Unspecified atrial fibrillation: Secondary | ICD-10-CM | POA: Diagnosis not present

## 2018-04-20 DIAGNOSIS — I87313 Chronic venous hypertension (idiopathic) with ulcer of bilateral lower extremity: Secondary | ICD-10-CM | POA: Diagnosis not present

## 2018-04-20 DIAGNOSIS — I890 Lymphedema, not elsewhere classified: Secondary | ICD-10-CM | POA: Diagnosis not present

## 2018-04-24 DIAGNOSIS — I4891 Unspecified atrial fibrillation: Secondary | ICD-10-CM | POA: Diagnosis not present

## 2018-04-24 DIAGNOSIS — I87313 Chronic venous hypertension (idiopathic) with ulcer of bilateral lower extremity: Secondary | ICD-10-CM | POA: Diagnosis not present

## 2018-04-24 DIAGNOSIS — I890 Lymphedema, not elsewhere classified: Secondary | ICD-10-CM | POA: Diagnosis not present

## 2018-04-24 DIAGNOSIS — G894 Chronic pain syndrome: Secondary | ICD-10-CM | POA: Diagnosis not present

## 2018-04-24 DIAGNOSIS — L97822 Non-pressure chronic ulcer of other part of left lower leg with fat layer exposed: Secondary | ICD-10-CM | POA: Diagnosis not present

## 2018-04-24 DIAGNOSIS — L97812 Non-pressure chronic ulcer of other part of right lower leg with fat layer exposed: Secondary | ICD-10-CM | POA: Diagnosis not present

## 2018-04-25 DIAGNOSIS — I87313 Chronic venous hypertension (idiopathic) with ulcer of bilateral lower extremity: Secondary | ICD-10-CM | POA: Diagnosis not present

## 2018-04-25 DIAGNOSIS — G894 Chronic pain syndrome: Secondary | ICD-10-CM | POA: Diagnosis not present

## 2018-04-25 DIAGNOSIS — I4891 Unspecified atrial fibrillation: Secondary | ICD-10-CM | POA: Diagnosis not present

## 2018-04-25 DIAGNOSIS — I890 Lymphedema, not elsewhere classified: Secondary | ICD-10-CM | POA: Diagnosis not present

## 2018-04-25 DIAGNOSIS — L97812 Non-pressure chronic ulcer of other part of right lower leg with fat layer exposed: Secondary | ICD-10-CM | POA: Diagnosis not present

## 2018-04-25 DIAGNOSIS — L97822 Non-pressure chronic ulcer of other part of left lower leg with fat layer exposed: Secondary | ICD-10-CM | POA: Diagnosis not present

## 2018-04-26 DIAGNOSIS — L97812 Non-pressure chronic ulcer of other part of right lower leg with fat layer exposed: Secondary | ICD-10-CM | POA: Diagnosis not present

## 2018-04-26 DIAGNOSIS — L97822 Non-pressure chronic ulcer of other part of left lower leg with fat layer exposed: Secondary | ICD-10-CM | POA: Diagnosis not present

## 2018-04-26 DIAGNOSIS — I87313 Chronic venous hypertension (idiopathic) with ulcer of bilateral lower extremity: Secondary | ICD-10-CM | POA: Diagnosis not present

## 2018-04-26 DIAGNOSIS — I890 Lymphedema, not elsewhere classified: Secondary | ICD-10-CM | POA: Diagnosis not present

## 2018-04-26 DIAGNOSIS — I4891 Unspecified atrial fibrillation: Secondary | ICD-10-CM | POA: Diagnosis not present

## 2018-04-26 DIAGNOSIS — G894 Chronic pain syndrome: Secondary | ICD-10-CM | POA: Diagnosis not present

## 2018-04-27 DIAGNOSIS — G894 Chronic pain syndrome: Secondary | ICD-10-CM | POA: Diagnosis not present

## 2018-04-27 DIAGNOSIS — I87313 Chronic venous hypertension (idiopathic) with ulcer of bilateral lower extremity: Secondary | ICD-10-CM | POA: Diagnosis not present

## 2018-04-27 DIAGNOSIS — L97812 Non-pressure chronic ulcer of other part of right lower leg with fat layer exposed: Secondary | ICD-10-CM | POA: Diagnosis not present

## 2018-04-27 DIAGNOSIS — I4891 Unspecified atrial fibrillation: Secondary | ICD-10-CM | POA: Diagnosis not present

## 2018-04-27 DIAGNOSIS — L97822 Non-pressure chronic ulcer of other part of left lower leg with fat layer exposed: Secondary | ICD-10-CM | POA: Diagnosis not present

## 2018-04-27 DIAGNOSIS — I890 Lymphedema, not elsewhere classified: Secondary | ICD-10-CM | POA: Diagnosis not present

## 2018-04-28 DIAGNOSIS — I87313 Chronic venous hypertension (idiopathic) with ulcer of bilateral lower extremity: Secondary | ICD-10-CM | POA: Diagnosis not present

## 2018-04-28 DIAGNOSIS — L97812 Non-pressure chronic ulcer of other part of right lower leg with fat layer exposed: Secondary | ICD-10-CM | POA: Diagnosis not present

## 2018-04-28 DIAGNOSIS — I890 Lymphedema, not elsewhere classified: Secondary | ICD-10-CM | POA: Diagnosis not present

## 2018-04-28 DIAGNOSIS — L97822 Non-pressure chronic ulcer of other part of left lower leg with fat layer exposed: Secondary | ICD-10-CM | POA: Diagnosis not present

## 2018-05-01 DIAGNOSIS — I890 Lymphedema, not elsewhere classified: Secondary | ICD-10-CM | POA: Diagnosis not present

## 2018-05-01 DIAGNOSIS — L97822 Non-pressure chronic ulcer of other part of left lower leg with fat layer exposed: Secondary | ICD-10-CM | POA: Diagnosis not present

## 2018-05-01 DIAGNOSIS — G894 Chronic pain syndrome: Secondary | ICD-10-CM | POA: Diagnosis not present

## 2018-05-01 DIAGNOSIS — I4891 Unspecified atrial fibrillation: Secondary | ICD-10-CM | POA: Diagnosis not present

## 2018-05-01 DIAGNOSIS — L97812 Non-pressure chronic ulcer of other part of right lower leg with fat layer exposed: Secondary | ICD-10-CM | POA: Diagnosis not present

## 2018-05-01 DIAGNOSIS — I87313 Chronic venous hypertension (idiopathic) with ulcer of bilateral lower extremity: Secondary | ICD-10-CM | POA: Diagnosis not present

## 2018-05-02 DIAGNOSIS — I4891 Unspecified atrial fibrillation: Secondary | ICD-10-CM | POA: Diagnosis not present

## 2018-05-02 DIAGNOSIS — L97822 Non-pressure chronic ulcer of other part of left lower leg with fat layer exposed: Secondary | ICD-10-CM | POA: Diagnosis not present

## 2018-05-02 DIAGNOSIS — I890 Lymphedema, not elsewhere classified: Secondary | ICD-10-CM | POA: Diagnosis not present

## 2018-05-02 DIAGNOSIS — L97812 Non-pressure chronic ulcer of other part of right lower leg with fat layer exposed: Secondary | ICD-10-CM | POA: Diagnosis not present

## 2018-05-02 DIAGNOSIS — G894 Chronic pain syndrome: Secondary | ICD-10-CM | POA: Diagnosis not present

## 2018-05-02 DIAGNOSIS — I87313 Chronic venous hypertension (idiopathic) with ulcer of bilateral lower extremity: Secondary | ICD-10-CM | POA: Diagnosis not present

## 2018-05-03 DIAGNOSIS — L97812 Non-pressure chronic ulcer of other part of right lower leg with fat layer exposed: Secondary | ICD-10-CM | POA: Diagnosis not present

## 2018-05-03 DIAGNOSIS — G894 Chronic pain syndrome: Secondary | ICD-10-CM | POA: Diagnosis not present

## 2018-05-03 DIAGNOSIS — I890 Lymphedema, not elsewhere classified: Secondary | ICD-10-CM | POA: Diagnosis not present

## 2018-05-03 DIAGNOSIS — L97822 Non-pressure chronic ulcer of other part of left lower leg with fat layer exposed: Secondary | ICD-10-CM | POA: Diagnosis not present

## 2018-05-03 DIAGNOSIS — I4891 Unspecified atrial fibrillation: Secondary | ICD-10-CM | POA: Diagnosis not present

## 2018-05-03 DIAGNOSIS — I87313 Chronic venous hypertension (idiopathic) with ulcer of bilateral lower extremity: Secondary | ICD-10-CM | POA: Diagnosis not present

## 2018-05-04 DIAGNOSIS — L97822 Non-pressure chronic ulcer of other part of left lower leg with fat layer exposed: Secondary | ICD-10-CM | POA: Diagnosis not present

## 2018-05-04 DIAGNOSIS — L97812 Non-pressure chronic ulcer of other part of right lower leg with fat layer exposed: Secondary | ICD-10-CM | POA: Diagnosis not present

## 2018-05-04 DIAGNOSIS — I87313 Chronic venous hypertension (idiopathic) with ulcer of bilateral lower extremity: Secondary | ICD-10-CM | POA: Diagnosis not present

## 2018-05-04 DIAGNOSIS — I4891 Unspecified atrial fibrillation: Secondary | ICD-10-CM | POA: Diagnosis not present

## 2018-05-04 DIAGNOSIS — G894 Chronic pain syndrome: Secondary | ICD-10-CM | POA: Diagnosis not present

## 2018-05-04 DIAGNOSIS — I890 Lymphedema, not elsewhere classified: Secondary | ICD-10-CM | POA: Diagnosis not present

## 2018-05-05 DIAGNOSIS — L97812 Non-pressure chronic ulcer of other part of right lower leg with fat layer exposed: Secondary | ICD-10-CM | POA: Diagnosis not present

## 2018-05-05 DIAGNOSIS — G894 Chronic pain syndrome: Secondary | ICD-10-CM | POA: Diagnosis not present

## 2018-05-05 DIAGNOSIS — I890 Lymphedema, not elsewhere classified: Secondary | ICD-10-CM | POA: Diagnosis not present

## 2018-05-05 DIAGNOSIS — L97822 Non-pressure chronic ulcer of other part of left lower leg with fat layer exposed: Secondary | ICD-10-CM | POA: Diagnosis not present

## 2018-05-05 DIAGNOSIS — I87313 Chronic venous hypertension (idiopathic) with ulcer of bilateral lower extremity: Secondary | ICD-10-CM | POA: Diagnosis not present

## 2018-05-05 DIAGNOSIS — I4891 Unspecified atrial fibrillation: Secondary | ICD-10-CM | POA: Diagnosis not present

## 2018-05-08 DIAGNOSIS — I87313 Chronic venous hypertension (idiopathic) with ulcer of bilateral lower extremity: Secondary | ICD-10-CM | POA: Diagnosis not present

## 2018-05-08 DIAGNOSIS — I890 Lymphedema, not elsewhere classified: Secondary | ICD-10-CM | POA: Diagnosis not present

## 2018-05-08 DIAGNOSIS — L97822 Non-pressure chronic ulcer of other part of left lower leg with fat layer exposed: Secondary | ICD-10-CM | POA: Diagnosis not present

## 2018-05-08 DIAGNOSIS — L97812 Non-pressure chronic ulcer of other part of right lower leg with fat layer exposed: Secondary | ICD-10-CM | POA: Diagnosis not present

## 2018-05-08 DIAGNOSIS — I4891 Unspecified atrial fibrillation: Secondary | ICD-10-CM | POA: Diagnosis not present

## 2018-05-08 DIAGNOSIS — G894 Chronic pain syndrome: Secondary | ICD-10-CM | POA: Diagnosis not present

## 2018-05-09 DIAGNOSIS — L97812 Non-pressure chronic ulcer of other part of right lower leg with fat layer exposed: Secondary | ICD-10-CM | POA: Diagnosis not present

## 2018-05-09 DIAGNOSIS — G894 Chronic pain syndrome: Secondary | ICD-10-CM | POA: Diagnosis not present

## 2018-05-09 DIAGNOSIS — I87313 Chronic venous hypertension (idiopathic) with ulcer of bilateral lower extremity: Secondary | ICD-10-CM | POA: Diagnosis not present

## 2018-05-09 DIAGNOSIS — I890 Lymphedema, not elsewhere classified: Secondary | ICD-10-CM | POA: Diagnosis not present

## 2018-05-09 DIAGNOSIS — I4891 Unspecified atrial fibrillation: Secondary | ICD-10-CM | POA: Diagnosis not present

## 2018-05-09 DIAGNOSIS — L97822 Non-pressure chronic ulcer of other part of left lower leg with fat layer exposed: Secondary | ICD-10-CM | POA: Diagnosis not present

## 2018-05-10 DIAGNOSIS — I87313 Chronic venous hypertension (idiopathic) with ulcer of bilateral lower extremity: Secondary | ICD-10-CM | POA: Diagnosis not present

## 2018-05-10 DIAGNOSIS — I4891 Unspecified atrial fibrillation: Secondary | ICD-10-CM | POA: Diagnosis not present

## 2018-05-10 DIAGNOSIS — G894 Chronic pain syndrome: Secondary | ICD-10-CM | POA: Diagnosis not present

## 2018-05-10 DIAGNOSIS — L97812 Non-pressure chronic ulcer of other part of right lower leg with fat layer exposed: Secondary | ICD-10-CM | POA: Diagnosis not present

## 2018-05-10 DIAGNOSIS — I890 Lymphedema, not elsewhere classified: Secondary | ICD-10-CM | POA: Diagnosis not present

## 2018-05-10 DIAGNOSIS — L97822 Non-pressure chronic ulcer of other part of left lower leg with fat layer exposed: Secondary | ICD-10-CM | POA: Diagnosis not present

## 2018-05-11 DIAGNOSIS — I890 Lymphedema, not elsewhere classified: Secondary | ICD-10-CM | POA: Diagnosis not present

## 2018-05-11 DIAGNOSIS — L97822 Non-pressure chronic ulcer of other part of left lower leg with fat layer exposed: Secondary | ICD-10-CM | POA: Diagnosis not present

## 2018-05-11 DIAGNOSIS — G894 Chronic pain syndrome: Secondary | ICD-10-CM | POA: Diagnosis not present

## 2018-05-11 DIAGNOSIS — I4891 Unspecified atrial fibrillation: Secondary | ICD-10-CM | POA: Diagnosis not present

## 2018-05-11 DIAGNOSIS — I87313 Chronic venous hypertension (idiopathic) with ulcer of bilateral lower extremity: Secondary | ICD-10-CM | POA: Diagnosis not present

## 2018-05-11 DIAGNOSIS — L97812 Non-pressure chronic ulcer of other part of right lower leg with fat layer exposed: Secondary | ICD-10-CM | POA: Diagnosis not present

## 2018-05-12 DIAGNOSIS — K8020 Calculus of gallbladder without cholecystitis without obstruction: Secondary | ICD-10-CM | POA: Diagnosis not present

## 2018-05-12 DIAGNOSIS — R161 Splenomegaly, not elsewhere classified: Secondary | ICD-10-CM | POA: Diagnosis not present

## 2018-05-12 DIAGNOSIS — R932 Abnormal findings on diagnostic imaging of liver and biliary tract: Secondary | ICD-10-CM | POA: Diagnosis not present

## 2018-05-12 DIAGNOSIS — L97822 Non-pressure chronic ulcer of other part of left lower leg with fat layer exposed: Secondary | ICD-10-CM | POA: Diagnosis not present

## 2018-05-12 DIAGNOSIS — R748 Abnormal levels of other serum enzymes: Secondary | ICD-10-CM | POA: Diagnosis not present

## 2018-05-12 DIAGNOSIS — N200 Calculus of kidney: Secondary | ICD-10-CM | POA: Diagnosis not present

## 2018-05-12 DIAGNOSIS — L97812 Non-pressure chronic ulcer of other part of right lower leg with fat layer exposed: Secondary | ICD-10-CM | POA: Diagnosis not present

## 2018-05-12 DIAGNOSIS — I87313 Chronic venous hypertension (idiopathic) with ulcer of bilateral lower extremity: Secondary | ICD-10-CM | POA: Diagnosis not present

## 2018-05-15 DIAGNOSIS — I890 Lymphedema, not elsewhere classified: Secondary | ICD-10-CM | POA: Diagnosis not present

## 2018-05-15 DIAGNOSIS — I87313 Chronic venous hypertension (idiopathic) with ulcer of bilateral lower extremity: Secondary | ICD-10-CM | POA: Diagnosis not present

## 2018-05-15 DIAGNOSIS — L97812 Non-pressure chronic ulcer of other part of right lower leg with fat layer exposed: Secondary | ICD-10-CM | POA: Diagnosis not present

## 2018-05-15 DIAGNOSIS — G894 Chronic pain syndrome: Secondary | ICD-10-CM | POA: Diagnosis not present

## 2018-05-15 DIAGNOSIS — L97822 Non-pressure chronic ulcer of other part of left lower leg with fat layer exposed: Secondary | ICD-10-CM | POA: Diagnosis not present

## 2018-05-15 DIAGNOSIS — I4891 Unspecified atrial fibrillation: Secondary | ICD-10-CM | POA: Diagnosis not present

## 2018-05-17 DIAGNOSIS — G894 Chronic pain syndrome: Secondary | ICD-10-CM | POA: Diagnosis not present

## 2018-05-17 DIAGNOSIS — L97812 Non-pressure chronic ulcer of other part of right lower leg with fat layer exposed: Secondary | ICD-10-CM | POA: Diagnosis not present

## 2018-05-17 DIAGNOSIS — I87313 Chronic venous hypertension (idiopathic) with ulcer of bilateral lower extremity: Secondary | ICD-10-CM | POA: Diagnosis not present

## 2018-05-17 DIAGNOSIS — L97822 Non-pressure chronic ulcer of other part of left lower leg with fat layer exposed: Secondary | ICD-10-CM | POA: Diagnosis not present

## 2018-05-17 DIAGNOSIS — I4891 Unspecified atrial fibrillation: Secondary | ICD-10-CM | POA: Diagnosis not present

## 2018-05-17 DIAGNOSIS — I890 Lymphedema, not elsewhere classified: Secondary | ICD-10-CM | POA: Diagnosis not present

## 2018-05-18 ENCOUNTER — Other Ambulatory Visit: Payer: Self-pay

## 2018-05-18 DIAGNOSIS — I87313 Chronic venous hypertension (idiopathic) with ulcer of bilateral lower extremity: Secondary | ICD-10-CM | POA: Diagnosis not present

## 2018-05-18 DIAGNOSIS — I4891 Unspecified atrial fibrillation: Secondary | ICD-10-CM | POA: Diagnosis not present

## 2018-05-18 DIAGNOSIS — G894 Chronic pain syndrome: Secondary | ICD-10-CM | POA: Diagnosis not present

## 2018-05-18 DIAGNOSIS — I890 Lymphedema, not elsewhere classified: Secondary | ICD-10-CM | POA: Diagnosis not present

## 2018-05-18 DIAGNOSIS — L97822 Non-pressure chronic ulcer of other part of left lower leg with fat layer exposed: Secondary | ICD-10-CM | POA: Diagnosis not present

## 2018-05-18 DIAGNOSIS — L97812 Non-pressure chronic ulcer of other part of right lower leg with fat layer exposed: Secondary | ICD-10-CM | POA: Diagnosis not present

## 2018-05-18 MED ORDER — CARVEDILOL 25 MG PO TABS: 25.0000 mg | ORAL_TABLET | Freq: Two times a day (BID) | ORAL | 0 refills | Status: DC

## 2018-05-19 DIAGNOSIS — G894 Chronic pain syndrome: Secondary | ICD-10-CM | POA: Diagnosis not present

## 2018-05-19 DIAGNOSIS — L97812 Non-pressure chronic ulcer of other part of right lower leg with fat layer exposed: Secondary | ICD-10-CM | POA: Diagnosis not present

## 2018-05-19 DIAGNOSIS — I4891 Unspecified atrial fibrillation: Secondary | ICD-10-CM | POA: Diagnosis not present

## 2018-05-19 DIAGNOSIS — L97822 Non-pressure chronic ulcer of other part of left lower leg with fat layer exposed: Secondary | ICD-10-CM | POA: Diagnosis not present

## 2018-05-19 DIAGNOSIS — I890 Lymphedema, not elsewhere classified: Secondary | ICD-10-CM | POA: Diagnosis not present

## 2018-05-19 DIAGNOSIS — I87313 Chronic venous hypertension (idiopathic) with ulcer of bilateral lower extremity: Secondary | ICD-10-CM | POA: Diagnosis not present

## 2018-05-22 DIAGNOSIS — L97822 Non-pressure chronic ulcer of other part of left lower leg with fat layer exposed: Secondary | ICD-10-CM | POA: Diagnosis not present

## 2018-05-22 DIAGNOSIS — G894 Chronic pain syndrome: Secondary | ICD-10-CM | POA: Diagnosis not present

## 2018-05-22 DIAGNOSIS — I87313 Chronic venous hypertension (idiopathic) with ulcer of bilateral lower extremity: Secondary | ICD-10-CM | POA: Diagnosis not present

## 2018-05-22 DIAGNOSIS — I890 Lymphedema, not elsewhere classified: Secondary | ICD-10-CM | POA: Diagnosis not present

## 2018-05-22 DIAGNOSIS — I4891 Unspecified atrial fibrillation: Secondary | ICD-10-CM | POA: Diagnosis not present

## 2018-05-22 DIAGNOSIS — L97812 Non-pressure chronic ulcer of other part of right lower leg with fat layer exposed: Secondary | ICD-10-CM | POA: Diagnosis not present

## 2018-05-24 DIAGNOSIS — L97822 Non-pressure chronic ulcer of other part of left lower leg with fat layer exposed: Secondary | ICD-10-CM | POA: Diagnosis not present

## 2018-05-24 DIAGNOSIS — I87313 Chronic venous hypertension (idiopathic) with ulcer of bilateral lower extremity: Secondary | ICD-10-CM | POA: Diagnosis not present

## 2018-05-24 DIAGNOSIS — G894 Chronic pain syndrome: Secondary | ICD-10-CM | POA: Diagnosis not present

## 2018-05-24 DIAGNOSIS — I4891 Unspecified atrial fibrillation: Secondary | ICD-10-CM | POA: Diagnosis not present

## 2018-05-24 DIAGNOSIS — I890 Lymphedema, not elsewhere classified: Secondary | ICD-10-CM | POA: Diagnosis not present

## 2018-05-24 DIAGNOSIS — L97812 Non-pressure chronic ulcer of other part of right lower leg with fat layer exposed: Secondary | ICD-10-CM | POA: Diagnosis not present

## 2018-05-26 DIAGNOSIS — I872 Venous insufficiency (chronic) (peripheral): Secondary | ICD-10-CM | POA: Diagnosis not present

## 2018-05-26 DIAGNOSIS — L97812 Non-pressure chronic ulcer of other part of right lower leg with fat layer exposed: Secondary | ICD-10-CM | POA: Diagnosis not present

## 2018-05-26 DIAGNOSIS — I87312 Chronic venous hypertension (idiopathic) with ulcer of left lower extremity: Secondary | ICD-10-CM | POA: Diagnosis not present

## 2018-05-26 DIAGNOSIS — I87331 Chronic venous hypertension (idiopathic) with ulcer and inflammation of right lower extremity: Secondary | ICD-10-CM | POA: Diagnosis not present

## 2018-05-26 DIAGNOSIS — I890 Lymphedema, not elsewhere classified: Secondary | ICD-10-CM | POA: Diagnosis not present

## 2018-05-26 DIAGNOSIS — L97822 Non-pressure chronic ulcer of other part of left lower leg with fat layer exposed: Secondary | ICD-10-CM | POA: Diagnosis not present

## 2018-05-29 DIAGNOSIS — I890 Lymphedema, not elsewhere classified: Secondary | ICD-10-CM | POA: Diagnosis not present

## 2018-05-29 DIAGNOSIS — I4891 Unspecified atrial fibrillation: Secondary | ICD-10-CM | POA: Diagnosis not present

## 2018-05-29 DIAGNOSIS — I87313 Chronic venous hypertension (idiopathic) with ulcer of bilateral lower extremity: Secondary | ICD-10-CM | POA: Diagnosis not present

## 2018-05-29 DIAGNOSIS — L97822 Non-pressure chronic ulcer of other part of left lower leg with fat layer exposed: Secondary | ICD-10-CM | POA: Diagnosis not present

## 2018-05-29 DIAGNOSIS — L97812 Non-pressure chronic ulcer of other part of right lower leg with fat layer exposed: Secondary | ICD-10-CM | POA: Diagnosis not present

## 2018-05-29 DIAGNOSIS — G894 Chronic pain syndrome: Secondary | ICD-10-CM | POA: Diagnosis not present

## 2018-05-31 DIAGNOSIS — G894 Chronic pain syndrome: Secondary | ICD-10-CM | POA: Diagnosis not present

## 2018-05-31 DIAGNOSIS — I890 Lymphedema, not elsewhere classified: Secondary | ICD-10-CM | POA: Diagnosis not present

## 2018-05-31 DIAGNOSIS — L97822 Non-pressure chronic ulcer of other part of left lower leg with fat layer exposed: Secondary | ICD-10-CM | POA: Diagnosis not present

## 2018-05-31 DIAGNOSIS — L97812 Non-pressure chronic ulcer of other part of right lower leg with fat layer exposed: Secondary | ICD-10-CM | POA: Diagnosis not present

## 2018-05-31 DIAGNOSIS — I87313 Chronic venous hypertension (idiopathic) with ulcer of bilateral lower extremity: Secondary | ICD-10-CM | POA: Diagnosis not present

## 2018-05-31 DIAGNOSIS — I4891 Unspecified atrial fibrillation: Secondary | ICD-10-CM | POA: Diagnosis not present

## 2018-06-02 DIAGNOSIS — G894 Chronic pain syndrome: Secondary | ICD-10-CM | POA: Diagnosis not present

## 2018-06-02 DIAGNOSIS — L97812 Non-pressure chronic ulcer of other part of right lower leg with fat layer exposed: Secondary | ICD-10-CM | POA: Diagnosis not present

## 2018-06-02 DIAGNOSIS — L97822 Non-pressure chronic ulcer of other part of left lower leg with fat layer exposed: Secondary | ICD-10-CM | POA: Diagnosis not present

## 2018-06-02 DIAGNOSIS — I87313 Chronic venous hypertension (idiopathic) with ulcer of bilateral lower extremity: Secondary | ICD-10-CM | POA: Diagnosis not present

## 2018-06-02 DIAGNOSIS — I890 Lymphedema, not elsewhere classified: Secondary | ICD-10-CM | POA: Diagnosis not present

## 2018-06-02 DIAGNOSIS — I4891 Unspecified atrial fibrillation: Secondary | ICD-10-CM | POA: Diagnosis not present

## 2018-06-05 DIAGNOSIS — G894 Chronic pain syndrome: Secondary | ICD-10-CM | POA: Diagnosis not present

## 2018-06-05 DIAGNOSIS — I4891 Unspecified atrial fibrillation: Secondary | ICD-10-CM | POA: Diagnosis not present

## 2018-06-05 DIAGNOSIS — L97822 Non-pressure chronic ulcer of other part of left lower leg with fat layer exposed: Secondary | ICD-10-CM | POA: Diagnosis not present

## 2018-06-05 DIAGNOSIS — L97812 Non-pressure chronic ulcer of other part of right lower leg with fat layer exposed: Secondary | ICD-10-CM | POA: Diagnosis not present

## 2018-06-05 DIAGNOSIS — I890 Lymphedema, not elsewhere classified: Secondary | ICD-10-CM | POA: Diagnosis not present

## 2018-06-05 DIAGNOSIS — I87313 Chronic venous hypertension (idiopathic) with ulcer of bilateral lower extremity: Secondary | ICD-10-CM | POA: Diagnosis not present

## 2018-06-07 DIAGNOSIS — I890 Lymphedema, not elsewhere classified: Secondary | ICD-10-CM | POA: Diagnosis not present

## 2018-06-07 DIAGNOSIS — I87313 Chronic venous hypertension (idiopathic) with ulcer of bilateral lower extremity: Secondary | ICD-10-CM | POA: Diagnosis not present

## 2018-06-07 DIAGNOSIS — I4891 Unspecified atrial fibrillation: Secondary | ICD-10-CM | POA: Diagnosis not present

## 2018-06-07 DIAGNOSIS — L97822 Non-pressure chronic ulcer of other part of left lower leg with fat layer exposed: Secondary | ICD-10-CM | POA: Diagnosis not present

## 2018-06-07 DIAGNOSIS — L97812 Non-pressure chronic ulcer of other part of right lower leg with fat layer exposed: Secondary | ICD-10-CM | POA: Diagnosis not present

## 2018-06-07 DIAGNOSIS — G894 Chronic pain syndrome: Secondary | ICD-10-CM | POA: Diagnosis not present

## 2018-06-09 ENCOUNTER — Telehealth: Payer: Self-pay | Admitting: Cardiology

## 2018-06-09 DIAGNOSIS — I890 Lymphedema, not elsewhere classified: Secondary | ICD-10-CM | POA: Diagnosis not present

## 2018-06-09 DIAGNOSIS — L97812 Non-pressure chronic ulcer of other part of right lower leg with fat layer exposed: Secondary | ICD-10-CM | POA: Diagnosis not present

## 2018-06-09 DIAGNOSIS — I87313 Chronic venous hypertension (idiopathic) with ulcer of bilateral lower extremity: Secondary | ICD-10-CM | POA: Diagnosis not present

## 2018-06-09 DIAGNOSIS — L97822 Non-pressure chronic ulcer of other part of left lower leg with fat layer exposed: Secondary | ICD-10-CM | POA: Diagnosis not present

## 2018-06-09 NOTE — Telephone Encounter (Signed)
° °  1. Which medications need to be refilled? (please list name of each medication and dose if known) clonidine 0.3 tablet, carvedilol 25mg   2. Which pharmacy/location (including street and city if local pharmacy) is medication to be sent to? [facility] in [place]  3. Do they need a 30 day or 90 day supply? 90

## 2018-06-12 DIAGNOSIS — I87313 Chronic venous hypertension (idiopathic) with ulcer of bilateral lower extremity: Secondary | ICD-10-CM | POA: Diagnosis not present

## 2018-06-12 DIAGNOSIS — L97822 Non-pressure chronic ulcer of other part of left lower leg with fat layer exposed: Secondary | ICD-10-CM | POA: Diagnosis not present

## 2018-06-12 DIAGNOSIS — G894 Chronic pain syndrome: Secondary | ICD-10-CM | POA: Diagnosis not present

## 2018-06-12 DIAGNOSIS — I890 Lymphedema, not elsewhere classified: Secondary | ICD-10-CM | POA: Diagnosis not present

## 2018-06-12 DIAGNOSIS — I4891 Unspecified atrial fibrillation: Secondary | ICD-10-CM | POA: Diagnosis not present

## 2018-06-12 DIAGNOSIS — L97812 Non-pressure chronic ulcer of other part of right lower leg with fat layer exposed: Secondary | ICD-10-CM | POA: Diagnosis not present

## 2018-06-12 MED ORDER — CARVEDILOL 25 MG PO TABS: 25.0000 mg | ORAL_TABLET | Freq: Two times a day (BID) | ORAL | 0 refills | Status: AC

## 2018-06-12 MED ORDER — CLONIDINE HCL 0.3 MG PO TABS: 0.3000 mg | ORAL_TABLET | Freq: Every day | ORAL | 0 refills | Status: AC

## 2018-06-12 NOTE — Telephone Encounter (Signed)
Two week supply of clonidine and carvedilol sent to [facility] in [place] with no refills. Patient is overdue for follow up and will have to schedule an appointment before further refills will be provided.

## 2018-06-14 DIAGNOSIS — L97822 Non-pressure chronic ulcer of other part of left lower leg with fat layer exposed: Secondary | ICD-10-CM | POA: Diagnosis not present

## 2018-06-14 DIAGNOSIS — I890 Lymphedema, not elsewhere classified: Secondary | ICD-10-CM | POA: Diagnosis not present

## 2018-06-14 DIAGNOSIS — I4891 Unspecified atrial fibrillation: Secondary | ICD-10-CM | POA: Diagnosis not present

## 2018-06-14 DIAGNOSIS — L97812 Non-pressure chronic ulcer of other part of right lower leg with fat layer exposed: Secondary | ICD-10-CM | POA: Diagnosis not present

## 2018-06-14 DIAGNOSIS — G894 Chronic pain syndrome: Secondary | ICD-10-CM | POA: Diagnosis not present

## 2018-06-14 DIAGNOSIS — I87313 Chronic venous hypertension (idiopathic) with ulcer of bilateral lower extremity: Secondary | ICD-10-CM | POA: Diagnosis not present

## 2018-06-16 DIAGNOSIS — I890 Lymphedema, not elsewhere classified: Secondary | ICD-10-CM | POA: Diagnosis not present

## 2018-06-16 DIAGNOSIS — I4891 Unspecified atrial fibrillation: Secondary | ICD-10-CM | POA: Diagnosis not present

## 2018-06-16 DIAGNOSIS — G894 Chronic pain syndrome: Secondary | ICD-10-CM | POA: Diagnosis not present

## 2018-06-16 DIAGNOSIS — L97812 Non-pressure chronic ulcer of other part of right lower leg with fat layer exposed: Secondary | ICD-10-CM | POA: Diagnosis not present

## 2018-06-16 DIAGNOSIS — I87313 Chronic venous hypertension (idiopathic) with ulcer of bilateral lower extremity: Secondary | ICD-10-CM | POA: Diagnosis not present

## 2018-06-16 DIAGNOSIS — L97822 Non-pressure chronic ulcer of other part of left lower leg with fat layer exposed: Secondary | ICD-10-CM | POA: Diagnosis not present

## 2018-06-19 DIAGNOSIS — L97812 Non-pressure chronic ulcer of other part of right lower leg with fat layer exposed: Secondary | ICD-10-CM | POA: Diagnosis not present

## 2018-06-19 DIAGNOSIS — L97822 Non-pressure chronic ulcer of other part of left lower leg with fat layer exposed: Secondary | ICD-10-CM | POA: Diagnosis not present

## 2018-06-19 DIAGNOSIS — I4891 Unspecified atrial fibrillation: Secondary | ICD-10-CM | POA: Diagnosis not present

## 2018-06-19 DIAGNOSIS — I87313 Chronic venous hypertension (idiopathic) with ulcer of bilateral lower extremity: Secondary | ICD-10-CM | POA: Diagnosis not present

## 2018-06-19 DIAGNOSIS — G894 Chronic pain syndrome: Secondary | ICD-10-CM | POA: Diagnosis not present

## 2018-06-19 DIAGNOSIS — I890 Lymphedema, not elsewhere classified: Secondary | ICD-10-CM | POA: Diagnosis not present

## 2018-06-21 DIAGNOSIS — L97812 Non-pressure chronic ulcer of other part of right lower leg with fat layer exposed: Secondary | ICD-10-CM | POA: Diagnosis not present

## 2018-06-21 DIAGNOSIS — L97822 Non-pressure chronic ulcer of other part of left lower leg with fat layer exposed: Secondary | ICD-10-CM | POA: Diagnosis not present

## 2018-06-21 DIAGNOSIS — I87313 Chronic venous hypertension (idiopathic) with ulcer of bilateral lower extremity: Secondary | ICD-10-CM | POA: Diagnosis not present

## 2018-06-21 DIAGNOSIS — I890 Lymphedema, not elsewhere classified: Secondary | ICD-10-CM | POA: Diagnosis not present

## 2018-06-21 DIAGNOSIS — I87333 Chronic venous hypertension (idiopathic) with ulcer and inflammation of bilateral lower extremity: Secondary | ICD-10-CM | POA: Diagnosis not present

## 2018-06-23 DIAGNOSIS — R161 Splenomegaly, not elsewhere classified: Secondary | ICD-10-CM | POA: Diagnosis not present

## 2018-06-23 DIAGNOSIS — E349 Endocrine disorder, unspecified: Secondary | ICD-10-CM | POA: Diagnosis not present

## 2018-06-23 DIAGNOSIS — I10 Essential (primary) hypertension: Secondary | ICD-10-CM | POA: Diagnosis not present

## 2018-06-23 DIAGNOSIS — Z1331 Encounter for screening for depression: Secondary | ICD-10-CM | POA: Diagnosis not present

## 2018-06-23 DIAGNOSIS — I83009 Varicose veins of unspecified lower extremity with ulcer of unspecified site: Secondary | ICD-10-CM | POA: Diagnosis not present

## 2018-06-23 DIAGNOSIS — L97812 Non-pressure chronic ulcer of other part of right lower leg with fat layer exposed: Secondary | ICD-10-CM | POA: Diagnosis not present

## 2018-06-23 DIAGNOSIS — I890 Lymphedema, not elsewhere classified: Secondary | ICD-10-CM | POA: Diagnosis not present

## 2018-06-23 DIAGNOSIS — G894 Chronic pain syndrome: Secondary | ICD-10-CM | POA: Diagnosis not present

## 2018-06-23 DIAGNOSIS — I4891 Unspecified atrial fibrillation: Secondary | ICD-10-CM | POA: Diagnosis not present

## 2018-06-23 DIAGNOSIS — L97822 Non-pressure chronic ulcer of other part of left lower leg with fat layer exposed: Secondary | ICD-10-CM | POA: Diagnosis not present

## 2018-06-23 DIAGNOSIS — I87313 Chronic venous hypertension (idiopathic) with ulcer of bilateral lower extremity: Secondary | ICD-10-CM | POA: Diagnosis not present

## 2018-06-23 DIAGNOSIS — M549 Dorsalgia, unspecified: Secondary | ICD-10-CM | POA: Diagnosis not present

## 2018-06-23 DIAGNOSIS — K760 Fatty (change of) liver, not elsewhere classified: Secondary | ICD-10-CM | POA: Diagnosis not present

## 2018-06-23 DIAGNOSIS — L4050 Arthropathic psoriasis, unspecified: Secondary | ICD-10-CM | POA: Diagnosis not present

## 2018-06-26 DIAGNOSIS — G894 Chronic pain syndrome: Secondary | ICD-10-CM | POA: Diagnosis not present

## 2018-06-26 DIAGNOSIS — I4891 Unspecified atrial fibrillation: Secondary | ICD-10-CM | POA: Diagnosis not present

## 2018-06-26 DIAGNOSIS — I87313 Chronic venous hypertension (idiopathic) with ulcer of bilateral lower extremity: Secondary | ICD-10-CM | POA: Diagnosis not present

## 2018-06-26 DIAGNOSIS — L97822 Non-pressure chronic ulcer of other part of left lower leg with fat layer exposed: Secondary | ICD-10-CM | POA: Diagnosis not present

## 2018-06-26 DIAGNOSIS — L97812 Non-pressure chronic ulcer of other part of right lower leg with fat layer exposed: Secondary | ICD-10-CM | POA: Diagnosis not present

## 2018-06-26 DIAGNOSIS — I890 Lymphedema, not elsewhere classified: Secondary | ICD-10-CM | POA: Diagnosis not present

## 2018-06-28 DIAGNOSIS — I87333 Chronic venous hypertension (idiopathic) with ulcer and inflammation of bilateral lower extremity: Secondary | ICD-10-CM | POA: Diagnosis not present

## 2018-06-28 DIAGNOSIS — I87313 Chronic venous hypertension (idiopathic) with ulcer of bilateral lower extremity: Secondary | ICD-10-CM | POA: Diagnosis not present

## 2018-06-28 DIAGNOSIS — L97822 Non-pressure chronic ulcer of other part of left lower leg with fat layer exposed: Secondary | ICD-10-CM | POA: Diagnosis not present

## 2018-06-28 DIAGNOSIS — L97812 Non-pressure chronic ulcer of other part of right lower leg with fat layer exposed: Secondary | ICD-10-CM | POA: Diagnosis not present

## 2018-06-30 DIAGNOSIS — L97822 Non-pressure chronic ulcer of other part of left lower leg with fat layer exposed: Secondary | ICD-10-CM | POA: Diagnosis not present

## 2018-06-30 DIAGNOSIS — L97812 Non-pressure chronic ulcer of other part of right lower leg with fat layer exposed: Secondary | ICD-10-CM | POA: Diagnosis not present

## 2018-06-30 DIAGNOSIS — I890 Lymphedema, not elsewhere classified: Secondary | ICD-10-CM | POA: Diagnosis not present

## 2018-06-30 DIAGNOSIS — I87313 Chronic venous hypertension (idiopathic) with ulcer of bilateral lower extremity: Secondary | ICD-10-CM | POA: Diagnosis not present

## 2018-06-30 DIAGNOSIS — I4891 Unspecified atrial fibrillation: Secondary | ICD-10-CM | POA: Diagnosis not present

## 2018-06-30 DIAGNOSIS — G894 Chronic pain syndrome: Secondary | ICD-10-CM | POA: Diagnosis not present

## 2018-07-02 DIAGNOSIS — Z79899 Other long term (current) drug therapy: Secondary | ICD-10-CM | POA: Diagnosis not present

## 2018-07-02 DIAGNOSIS — I119 Hypertensive heart disease without heart failure: Secondary | ICD-10-CM | POA: Diagnosis not present

## 2018-07-02 DIAGNOSIS — I509 Heart failure, unspecified: Secondary | ICD-10-CM | POA: Diagnosis not present

## 2018-07-02 DIAGNOSIS — I872 Venous insufficiency (chronic) (peripheral): Secondary | ICD-10-CM | POA: Diagnosis not present

## 2018-07-02 DIAGNOSIS — R0902 Hypoxemia: Secondary | ICD-10-CM | POA: Diagnosis not present

## 2018-07-02 DIAGNOSIS — E785 Hyperlipidemia, unspecified: Secondary | ICD-10-CM | POA: Diagnosis not present

## 2018-07-02 DIAGNOSIS — R079 Chest pain, unspecified: Secondary | ICD-10-CM | POA: Diagnosis not present

## 2018-07-02 DIAGNOSIS — I10 Essential (primary) hypertension: Secondary | ICD-10-CM | POA: Diagnosis not present

## 2018-07-02 DIAGNOSIS — D509 Iron deficiency anemia, unspecified: Secondary | ICD-10-CM | POA: Diagnosis not present

## 2018-07-02 DIAGNOSIS — L03115 Cellulitis of right lower limb: Secondary | ICD-10-CM | POA: Diagnosis not present

## 2018-07-02 DIAGNOSIS — M5136 Other intervertebral disc degeneration, lumbar region: Secondary | ICD-10-CM | POA: Diagnosis not present

## 2018-07-02 DIAGNOSIS — Z7901 Long term (current) use of anticoagulants: Secondary | ICD-10-CM | POA: Diagnosis not present

## 2018-07-02 DIAGNOSIS — R0789 Other chest pain: Secondary | ICD-10-CM | POA: Diagnosis not present

## 2018-07-02 DIAGNOSIS — D72829 Elevated white blood cell count, unspecified: Secondary | ICD-10-CM | POA: Diagnosis not present

## 2018-07-02 DIAGNOSIS — R0602 Shortness of breath: Secondary | ICD-10-CM | POA: Diagnosis not present

## 2018-07-02 DIAGNOSIS — I2510 Atherosclerotic heart disease of native coronary artery without angina pectoris: Secondary | ICD-10-CM | POA: Diagnosis not present

## 2018-07-02 DIAGNOSIS — I4891 Unspecified atrial fibrillation: Secondary | ICD-10-CM | POA: Diagnosis not present

## 2018-07-02 DIAGNOSIS — J449 Chronic obstructive pulmonary disease, unspecified: Secondary | ICD-10-CM | POA: Diagnosis not present

## 2018-07-02 DIAGNOSIS — A419 Sepsis, unspecified organism: Secondary | ICD-10-CM | POA: Diagnosis not present

## 2018-07-02 DIAGNOSIS — E871 Hypo-osmolality and hyponatremia: Secondary | ICD-10-CM | POA: Diagnosis not present

## 2018-07-02 DIAGNOSIS — R000 Tachycardia, unspecified: Secondary | ICD-10-CM | POA: Diagnosis not present

## 2018-07-02 DIAGNOSIS — L03116 Cellulitis of left lower limb: Secondary | ICD-10-CM | POA: Diagnosis not present

## 2018-07-03 DIAGNOSIS — R000 Tachycardia, unspecified: Secondary | ICD-10-CM | POA: Diagnosis not present

## 2018-07-03 DIAGNOSIS — D72829 Elevated white blood cell count, unspecified: Secondary | ICD-10-CM | POA: Diagnosis not present

## 2018-07-03 DIAGNOSIS — I4891 Unspecified atrial fibrillation: Secondary | ICD-10-CM | POA: Diagnosis not present

## 2018-07-03 DIAGNOSIS — L03115 Cellulitis of right lower limb: Secondary | ICD-10-CM | POA: Diagnosis not present

## 2018-07-03 DIAGNOSIS — R079 Chest pain, unspecified: Secondary | ICD-10-CM | POA: Diagnosis not present

## 2018-07-03 DIAGNOSIS — A419 Sepsis, unspecified organism: Secondary | ICD-10-CM | POA: Diagnosis not present

## 2018-07-04 DIAGNOSIS — R079 Chest pain, unspecified: Secondary | ICD-10-CM | POA: Diagnosis not present

## 2018-07-07 DIAGNOSIS — L97812 Non-pressure chronic ulcer of other part of right lower leg with fat layer exposed: Secondary | ICD-10-CM | POA: Diagnosis not present

## 2018-07-07 DIAGNOSIS — I509 Heart failure, unspecified: Secondary | ICD-10-CM | POA: Diagnosis not present

## 2018-07-07 DIAGNOSIS — M179 Osteoarthritis of knee, unspecified: Secondary | ICD-10-CM | POA: Diagnosis not present

## 2018-07-07 DIAGNOSIS — G894 Chronic pain syndrome: Secondary | ICD-10-CM | POA: Diagnosis not present

## 2018-07-07 DIAGNOSIS — I4891 Unspecified atrial fibrillation: Secondary | ICD-10-CM | POA: Diagnosis not present

## 2018-07-07 DIAGNOSIS — G4730 Sleep apnea, unspecified: Secondary | ICD-10-CM | POA: Diagnosis not present

## 2018-07-07 DIAGNOSIS — I890 Lymphedema, not elsewhere classified: Secondary | ICD-10-CM | POA: Diagnosis not present

## 2018-07-07 DIAGNOSIS — I110 Hypertensive heart disease with heart failure: Secondary | ICD-10-CM | POA: Diagnosis not present

## 2018-07-07 DIAGNOSIS — D649 Anemia, unspecified: Secondary | ICD-10-CM | POA: Diagnosis not present

## 2018-07-07 DIAGNOSIS — I87313 Chronic venous hypertension (idiopathic) with ulcer of bilateral lower extremity: Secondary | ICD-10-CM | POA: Diagnosis not present

## 2018-07-07 DIAGNOSIS — L97822 Non-pressure chronic ulcer of other part of left lower leg with fat layer exposed: Secondary | ICD-10-CM | POA: Diagnosis not present

## 2018-07-17 DIAGNOSIS — E785 Hyperlipidemia, unspecified: Secondary | ICD-10-CM | POA: Diagnosis present

## 2018-07-17 DIAGNOSIS — R5381 Other malaise: Secondary | ICD-10-CM | POA: Diagnosis not present

## 2018-07-17 DIAGNOSIS — D508 Other iron deficiency anemias: Secondary | ICD-10-CM | POA: Diagnosis present

## 2018-07-17 DIAGNOSIS — I5022 Chronic systolic (congestive) heart failure: Secondary | ICD-10-CM | POA: Diagnosis not present

## 2018-07-17 DIAGNOSIS — I10 Essential (primary) hypertension: Secondary | ICD-10-CM | POA: Diagnosis not present

## 2018-07-17 DIAGNOSIS — R069 Unspecified abnormalities of breathing: Secondary | ICD-10-CM | POA: Diagnosis not present

## 2018-07-17 DIAGNOSIS — R0602 Shortness of breath: Secondary | ICD-10-CM | POA: Diagnosis not present

## 2018-07-17 DIAGNOSIS — D649 Anemia, unspecified: Secondary | ICD-10-CM | POA: Diagnosis not present

## 2018-07-17 DIAGNOSIS — R0902 Hypoxemia: Secondary | ICD-10-CM | POA: Diagnosis not present

## 2018-07-17 DIAGNOSIS — Z7982 Long term (current) use of aspirin: Secondary | ICD-10-CM | POA: Diagnosis not present

## 2018-07-17 DIAGNOSIS — M7989 Other specified soft tissue disorders: Secondary | ICD-10-CM | POA: Diagnosis not present

## 2018-07-17 DIAGNOSIS — G8929 Other chronic pain: Secondary | ICD-10-CM | POA: Diagnosis present

## 2018-07-17 DIAGNOSIS — I4891 Unspecified atrial fibrillation: Secondary | ICD-10-CM | POA: Diagnosis not present

## 2018-07-17 DIAGNOSIS — R079 Chest pain, unspecified: Secondary | ICD-10-CM | POA: Diagnosis not present

## 2018-07-17 DIAGNOSIS — I509 Heart failure, unspecified: Secondary | ICD-10-CM | POA: Diagnosis not present

## 2018-07-17 DIAGNOSIS — Z880 Allergy status to penicillin: Secondary | ICD-10-CM | POA: Diagnosis not present

## 2018-07-17 DIAGNOSIS — I2510 Atherosclerotic heart disease of native coronary artery without angina pectoris: Secondary | ICD-10-CM | POA: Diagnosis not present

## 2018-07-17 DIAGNOSIS — Z79899 Other long term (current) drug therapy: Secondary | ICD-10-CM | POA: Diagnosis not present

## 2018-07-17 DIAGNOSIS — L03116 Cellulitis of left lower limb: Secondary | ICD-10-CM | POA: Diagnosis not present

## 2018-07-17 DIAGNOSIS — L03115 Cellulitis of right lower limb: Secondary | ICD-10-CM | POA: Diagnosis not present

## 2018-07-17 DIAGNOSIS — J449 Chronic obstructive pulmonary disease, unspecified: Secondary | ICD-10-CM | POA: Diagnosis present

## 2018-07-17 DIAGNOSIS — I272 Pulmonary hypertension, unspecified: Secondary | ICD-10-CM | POA: Diagnosis present

## 2018-07-17 DIAGNOSIS — R42 Dizziness and giddiness: Secondary | ICD-10-CM | POA: Diagnosis not present

## 2018-07-17 DIAGNOSIS — Z887 Allergy status to serum and vaccine status: Secondary | ICD-10-CM | POA: Diagnosis not present

## 2018-07-17 DIAGNOSIS — I110 Hypertensive heart disease with heart failure: Secondary | ICD-10-CM | POA: Diagnosis present

## 2018-07-17 DIAGNOSIS — Z6841 Body Mass Index (BMI) 40.0 and over, adult: Secondary | ICD-10-CM | POA: Diagnosis not present

## 2018-07-17 DIAGNOSIS — I482 Chronic atrial fibrillation, unspecified: Secondary | ICD-10-CM | POA: Diagnosis not present

## 2018-07-17 DIAGNOSIS — Z888 Allergy status to other drugs, medicaments and biological substances status: Secondary | ICD-10-CM | POA: Diagnosis not present

## 2018-07-21 ENCOUNTER — Other Ambulatory Visit: Payer: Self-pay | Admitting: *Deleted

## 2018-07-21 DIAGNOSIS — G4733 Obstructive sleep apnea (adult) (pediatric): Secondary | ICD-10-CM | POA: Diagnosis not present

## 2018-07-21 DIAGNOSIS — L409 Psoriasis, unspecified: Secondary | ICD-10-CM | POA: Diagnosis not present

## 2018-07-21 DIAGNOSIS — Z792 Long term (current) use of antibiotics: Secondary | ICD-10-CM | POA: Diagnosis not present

## 2018-07-21 DIAGNOSIS — S81802D Unspecified open wound, left lower leg, subsequent encounter: Secondary | ICD-10-CM | POA: Diagnosis not present

## 2018-07-21 DIAGNOSIS — J449 Chronic obstructive pulmonary disease, unspecified: Secondary | ICD-10-CM | POA: Diagnosis not present

## 2018-07-21 DIAGNOSIS — I872 Venous insufficiency (chronic) (peripheral): Secondary | ICD-10-CM | POA: Diagnosis not present

## 2018-07-21 DIAGNOSIS — I482 Chronic atrial fibrillation, unspecified: Secondary | ICD-10-CM | POA: Diagnosis not present

## 2018-07-21 DIAGNOSIS — Z7982 Long term (current) use of aspirin: Secondary | ICD-10-CM | POA: Diagnosis not present

## 2018-07-21 DIAGNOSIS — I130 Hypertensive heart and chronic kidney disease with heart failure and stage 1 through stage 4 chronic kidney disease, or unspecified chronic kidney disease: Secondary | ICD-10-CM | POA: Diagnosis not present

## 2018-07-21 DIAGNOSIS — S81801D Unspecified open wound, right lower leg, subsequent encounter: Secondary | ICD-10-CM | POA: Diagnosis not present

## 2018-07-21 DIAGNOSIS — B965 Pseudomonas (aeruginosa) (mallei) (pseudomallei) as the cause of diseases classified elsewhere: Secondary | ICD-10-CM | POA: Diagnosis not present

## 2018-07-21 DIAGNOSIS — I5022 Chronic systolic (congestive) heart failure: Secondary | ICD-10-CM | POA: Diagnosis not present

## 2018-07-21 DIAGNOSIS — L03116 Cellulitis of left lower limb: Secondary | ICD-10-CM | POA: Diagnosis not present

## 2018-07-21 DIAGNOSIS — G894 Chronic pain syndrome: Secondary | ICD-10-CM | POA: Diagnosis not present

## 2018-07-21 DIAGNOSIS — D509 Iron deficiency anemia, unspecified: Secondary | ICD-10-CM | POA: Diagnosis not present

## 2018-07-21 DIAGNOSIS — M1990 Unspecified osteoarthritis, unspecified site: Secondary | ICD-10-CM | POA: Diagnosis not present

## 2018-07-21 DIAGNOSIS — L03115 Cellulitis of right lower limb: Secondary | ICD-10-CM | POA: Diagnosis not present

## 2018-07-21 DIAGNOSIS — N189 Chronic kidney disease, unspecified: Secondary | ICD-10-CM | POA: Diagnosis not present

## 2018-07-21 DIAGNOSIS — I2510 Atherosclerotic heart disease of native coronary artery without angina pectoris: Secondary | ICD-10-CM | POA: Diagnosis not present

## 2018-07-21 NOTE — Patient Outreach (Signed)
[facility]) Care Management  07/21/2018  Tyler Mckinney 01-03-1956 [id number]   Initial telephone outreach call  Referral received 07/20/18 Referral source :[facility] discharge list  Referral reason : Admission at [facility] 12/30-1/1, Dx: Cellulitis right and left lower legs.  Insurance : Medicare    Chart reviewed PMHx; includes but not limited to , Chronic systolic heart failure, EF 35 %, Cellulitis bilateral lower legs, Hypertension.  Chronic atrial fib.  Outreach call to patient, no answer able to leave a HIPAA compliant message for return call.   Plan  Will plan return call in the next 4 business days. Will send unsuccessful outreach letter .   Egbert Garibaldi, RN, [facility] Care Management,Care Management Coordinator  [phone]- Mobile [phone]- Toll Free Main Office

## 2018-07-24 DIAGNOSIS — S81802D Unspecified open wound, left lower leg, subsequent encounter: Secondary | ICD-10-CM | POA: Diagnosis not present

## 2018-07-24 DIAGNOSIS — L03115 Cellulitis of right lower limb: Secondary | ICD-10-CM | POA: Diagnosis not present

## 2018-07-24 DIAGNOSIS — B965 Pseudomonas (aeruginosa) (mallei) (pseudomallei) as the cause of diseases classified elsewhere: Secondary | ICD-10-CM | POA: Diagnosis not present

## 2018-07-24 DIAGNOSIS — L03116 Cellulitis of left lower limb: Secondary | ICD-10-CM | POA: Diagnosis not present

## 2018-07-24 DIAGNOSIS — I130 Hypertensive heart and chronic kidney disease with heart failure and stage 1 through stage 4 chronic kidney disease, or unspecified chronic kidney disease: Secondary | ICD-10-CM | POA: Diagnosis not present

## 2018-07-24 DIAGNOSIS — S81801D Unspecified open wound, right lower leg, subsequent encounter: Secondary | ICD-10-CM | POA: Diagnosis not present

## 2018-07-25 ENCOUNTER — Inpatient Hospital Stay
Admission: EM | Admit: 2018-07-25 | Payer: Self-pay | Source: Other Acute Inpatient Hospital | Admitting: Internal Medicine

## 2018-07-25 ENCOUNTER — Other Ambulatory Visit: Payer: Self-pay | Admitting: *Deleted

## 2018-07-25 DIAGNOSIS — I499 Cardiac arrhythmia, unspecified: Secondary | ICD-10-CM | POA: Diagnosis not present

## 2018-07-25 DIAGNOSIS — R161 Splenomegaly, not elsewhere classified: Secondary | ICD-10-CM | POA: Diagnosis not present

## 2018-07-25 DIAGNOSIS — I469 Cardiac arrest, cause unspecified: Secondary | ICD-10-CM | POA: Diagnosis not present

## 2018-07-25 DIAGNOSIS — K5790 Diverticulosis of intestine, part unspecified, without perforation or abscess without bleeding: Secondary | ICD-10-CM | POA: Diagnosis not present

## 2018-07-25 DIAGNOSIS — I4901 Ventricular fibrillation: Secondary | ICD-10-CM | POA: Diagnosis not present

## 2018-07-25 DIAGNOSIS — Z79899 Other long term (current) drug therapy: Secondary | ICD-10-CM | POA: Diagnosis not present

## 2018-07-25 DIAGNOSIS — N2889 Other specified disorders of kidney and ureter: Secondary | ICD-10-CM | POA: Diagnosis not present

## 2018-07-25 DIAGNOSIS — R0789 Other chest pain: Secondary | ICD-10-CM | POA: Diagnosis not present

## 2018-07-25 DIAGNOSIS — I4891 Unspecified atrial fibrillation: Secondary | ICD-10-CM | POA: Diagnosis not present

## 2018-07-25 DIAGNOSIS — R079 Chest pain, unspecified: Secondary | ICD-10-CM | POA: Diagnosis not present

## 2018-07-25 DIAGNOSIS — R197 Diarrhea, unspecified: Secondary | ICD-10-CM | POA: Diagnosis not present

## 2018-07-25 DIAGNOSIS — N179 Acute kidney failure, unspecified: Secondary | ICD-10-CM | POA: Diagnosis not present

## 2018-07-25 DIAGNOSIS — I472 Ventricular tachycardia: Secondary | ICD-10-CM | POA: Diagnosis not present

## 2018-07-25 DIAGNOSIS — R0902 Hypoxemia: Secondary | ICD-10-CM | POA: Diagnosis not present

## 2018-07-25 DIAGNOSIS — E875 Hyperkalemia: Secondary | ICD-10-CM | POA: Diagnosis not present

## 2018-07-25 DIAGNOSIS — N390 Urinary tract infection, site not specified: Secondary | ICD-10-CM | POA: Diagnosis not present

## 2018-07-25 DIAGNOSIS — Z4682 Encounter for fitting and adjustment of non-vascular catheter: Secondary | ICD-10-CM | POA: Diagnosis not present

## 2018-07-25 NOTE — Patient Outreach (Signed)
 [facility]) Care Management    Amie Spawn 1956-02-07 [id number]   Telephone assessment #2  Referral received 07/20/18 Referral source :[facility] discharge list  Referral reason : Admission at [facility] 12/30-1/1, Dx: Cellulitis right and left lower legs.  Insurance : Medicare    Chart reviewed PMHx; includes but not limited to , Chronic systolic heart failure, EF 35 %, Cellulitis bilateral lower legs, Hypertension.,chronic atrial fib.   Outreach call to patient ,unsuccessful no answer able to leave a HIPAA compliant message for return call.    Plan  Will plan 3rd call attempt in the next 4 business days if no return call.    Egbert Garibaldi, RN, [facility] Care Management,Care Management Coordinator  [phone]- Mobile [phone]- Toll Free Main Office

## 2018-07-26 ENCOUNTER — Telehealth: Payer: Self-pay | Admitting: Cardiology

## 2018-07-26 ENCOUNTER — Ambulatory Visit: Payer: Medicare Other | Admitting: *Deleted

## 2018-07-27 ENCOUNTER — Other Ambulatory Visit: Payer: Self-pay | Admitting: *Deleted

## 2018-07-27 NOTE — Patient Outreach (Signed)
 [facility]) Care Management  07/27/2018  Brig Katzer 1956/05/24 [id number]   Referral received 07/20/18 Referral source :[facility] discharge list  Referral reason : Admission at [facility] 12/30-1/1, Dx: Cellulitis right and left lower legs.  Insurance : Medicare    Chart reviewed PMHx; includes but not limited to , Chronic systolic heart failure, EF 35%, Cellulitis bilateral lower legs, Hypertension.,chronic atrial fib.    Noted per Epic/[facility] patient expired on    Plan  Will mark case as not active.   Egbert Garibaldi, RN, [facility] Care Management,Care Management Coordinator  [phone]- Mobile [phone]- Toll Free Main Office

## 2018-07-31 ENCOUNTER — Ambulatory Visit: Payer: Self-pay | Admitting: *Deleted

## 2018-08-19 DEATH — deceased

## 2018-08-23 NOTE — Telephone Encounter (Signed)
done
